# Patient Record
Sex: Female | Born: 1935 | Race: White | Hispanic: No | Marital: Married | Smoking: Former smoker
Health system: Southern US, Community
[De-identification: ages and names within clinical notes are randomized; demographics above are authoritative.]

## PROBLEM LIST (undated history)

## (undated) DIAGNOSIS — Z72820 Sleep deprivation: Secondary | ICD-10-CM

## (undated) DIAGNOSIS — E039 Hypothyroidism, unspecified: Secondary | ICD-10-CM

## (undated) DIAGNOSIS — Z8719 Personal history of other diseases of the digestive system: Secondary | ICD-10-CM

## (undated) DIAGNOSIS — J189 Pneumonia, unspecified organism: Secondary | ICD-10-CM

## (undated) DIAGNOSIS — M797 Fibromyalgia: Secondary | ICD-10-CM

## (undated) DIAGNOSIS — K219 Gastro-esophageal reflux disease without esophagitis: Secondary | ICD-10-CM

## (undated) DIAGNOSIS — M1990 Unspecified osteoarthritis, unspecified site: Secondary | ICD-10-CM

## (undated) DIAGNOSIS — F419 Anxiety disorder, unspecified: Secondary | ICD-10-CM

## (undated) DIAGNOSIS — M549 Dorsalgia, unspecified: Secondary | ICD-10-CM

## (undated) DIAGNOSIS — M19012 Primary osteoarthritis, left shoulder: Secondary | ICD-10-CM

## (undated) DIAGNOSIS — M419 Scoliosis, unspecified: Secondary | ICD-10-CM

## (undated) DIAGNOSIS — F329 Major depressive disorder, single episode, unspecified: Secondary | ICD-10-CM

## (undated) DIAGNOSIS — F32A Depression, unspecified: Secondary | ICD-10-CM

## (undated) DIAGNOSIS — E7800 Pure hypercholesterolemia, unspecified: Secondary | ICD-10-CM

## (undated) HISTORY — PX: APPENDECTOMY: SHX54

## (undated) HISTORY — PX: TONSILLECTOMY: SUR1361

## (undated) HISTORY — PX: ABDOMINAL HYSTERECTOMY: SHX81

---
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
## 1979-04-30 HISTORY — PX: LIPOSUCTION: SHX10

## 2007-03-08 ENCOUNTER — Encounter: Admission: RE | Admit: 2007-03-08 | Discharge: 2007-03-08 | Payer: Self-pay | Admitting: Orthopedic Surgery

## 2011-07-09 ENCOUNTER — Encounter: Payer: Self-pay | Admitting: *Deleted

## 2011-07-09 ENCOUNTER — Emergency Department (HOSPITAL_BASED_OUTPATIENT_CLINIC_OR_DEPARTMENT_OTHER)
Admission: EM | Admit: 2011-07-09 | Discharge: 2011-07-09 | Disposition: A | Discharge status: Home / Self Care | Payer: Medicare Other | Attending: Emergency Medicine | Admitting: Emergency Medicine

## 2011-07-09 DIAGNOSIS — F341 Dysthymic disorder: Secondary | ICD-10-CM | POA: Insufficient documentation

## 2011-07-09 DIAGNOSIS — R040 Epistaxis: Secondary | ICD-10-CM

## 2011-07-09 HISTORY — DX: Anxiety disorder, unspecified: F41.9

## 2011-07-09 HISTORY — DX: Depression, unspecified: F32.A

## 2011-07-09 HISTORY — DX: Dorsalgia, unspecified: M54.9

## 2011-07-09 HISTORY — DX: Major depressive disorder, single episode, unspecified: F32.9

## 2011-07-09 HISTORY — DX: Scoliosis, unspecified: M41.9

## 2011-07-09 MED ORDER — PHENYLEPHRINE HCL 0.5 % NA SOLN
1.0000 [drp] | Freq: Four times a day (QID) | NASAL | Status: DC | PRN
  Filled 2011-07-09: qty 15

## 2011-07-09 NOTE — ED Provider Notes (Signed)
Scribed for [name], MD, the patient was seen in room MH11/MH11 . This chart was scribed by Ellie Lunch.   CSN: [id number] Arrival date & time: 07/09/2011  7:00 PM   First MD Initiated Contact with Patient 07/09/11 1907      Chief Complaint  Patient presents with  . Epistaxis    (Consider location/radiation/quality/duration/timing/severity/associated sxs/prior treatment) Patient is a 75 y.o. female presenting with nosebleeds. The history is provided by the patient. No language interpreter was used.  Epistaxis  This is a new problem. The problem occurs every several days (2 episodes this week). The problem has been resolved. The problem is associated with an unknown factor. The bleeding has been from both (starts right) nares. She has tried nothing for the symptoms.   Meagan Mathis is a 75 y.o. female who presents to the Emergency Department complaining of 2 episodes of epistaxis in the past 7 days. Epistaxis starts from right nare. Describes bleeding as profuse. Not on blood thinners. Has passed two large clots. Additionally Pt c/o nausea, tired, and slight HA.    Past Medical History  Diagnosis Date  . Scoliosis   . Depression   . Anxiety   . Back pain     Past Surgical History  Procedure Date  . Abdominal hysterectomy     History reviewed. No pertinent family history.  History  Substance Use Topics  . Smoking status: Never Smoker   . Smokeless tobacco: Not on file  . Alcohol Use: Yes    Review of Systems  HENT: Positive for nosebleeds.   Gastrointestinal: Positive for nausea.  Neurological: Positive for headaches.    Allergies  Review of patient's allergies indicates no known allergies.  Home Medications  No current outpatient prescriptions on file.  BP 123/63  Pulse 94  Temp(Src) 98.3 F (36.8 C) (Oral)  Resp 20  Ht 5\' 3"  (1.6 m)  Wt 159 lb (72.122 kg)  BMI 28.17 kg/m2  SpO2 96%  Physical Exam  Nursing note and vitals  reviewed. Constitutional: She is oriented to person, place, and time. She appears well-developed and well-nourished.  HENT:  Head: Normocephalic and atraumatic.  Nose: Nose normal.       Small ulcerative lesion on right septum and supervision vessel. No bleeding and no blood in posterior nasal pharynx.   Eyes: EOM are normal.  Neck: Normal range of motion.  Pulmonary/Chest: Effort normal.  Musculoskeletal: Normal range of motion.  Neurological: She is alert and oriented to person, place, and time.  Skin: Skin is warm and dry.  Psychiatric: She has a normal mood and affect.    ED Course  Procedures (including critical care time) OTHER DATA REVIEWED: Nursing notes, vital signs, and past medical records reviewed.   DIAGNOSTIC STUDIES: Oxygen Saturation is 96% on room air, normal by my interpretation.     No diagnosis found.    MDM   Pt with nosebleed that is now hemostatic.  Pt denies use of blood thinners but does use steroid nasal sprays.  Small ulcer on the nasal septum and superficial vessel but no signs of bleeding currently.  Pt has opted for nasal spray and no packing.  She understands that she may rebleed and would have to return.  I personally performed the services described in this documentation, which was scribed in my presence.  The recorded information has been reviewed and considered.       [name], MD 07/09/11 2033

## 2011-07-09 NOTE — ED Notes (Signed)
Pt states this is the 2nd nosebleed she has had this week. No prior hx. +nausea, tired, shaky, slight H/A at times

## 2011-07-09 NOTE — ED Notes (Signed)
Pt has been receiving injections for her chronic back pain.

## 2012-04-17 ENCOUNTER — Emergency Department (HOSPITAL_BASED_OUTPATIENT_CLINIC_OR_DEPARTMENT_OTHER)
Admission: EM | Admit: 2012-04-17 | Discharge: 2012-04-17 | Disposition: A | Discharge status: Home / Self Care | Payer: Medicare Other | Attending: Emergency Medicine | Admitting: Emergency Medicine

## 2012-04-17 ENCOUNTER — Emergency Department (HOSPITAL_BASED_OUTPATIENT_CLINIC_OR_DEPARTMENT_OTHER): Payer: Medicare Other

## 2012-04-17 ENCOUNTER — Encounter (HOSPITAL_BASED_OUTPATIENT_CLINIC_OR_DEPARTMENT_OTHER): Payer: Self-pay | Admitting: Emergency Medicine

## 2012-04-17 DIAGNOSIS — R112 Nausea with vomiting, unspecified: Secondary | ICD-10-CM | POA: Insufficient documentation

## 2012-04-17 DIAGNOSIS — M549 Dorsalgia, unspecified: Secondary | ICD-10-CM | POA: Insufficient documentation

## 2012-04-17 DIAGNOSIS — M4120 Other idiopathic scoliosis, site unspecified: Secondary | ICD-10-CM | POA: Insufficient documentation

## 2012-04-17 DIAGNOSIS — M129 Arthropathy, unspecified: Secondary | ICD-10-CM | POA: Insufficient documentation

## 2012-04-17 DIAGNOSIS — Z885 Allergy status to narcotic agent status: Secondary | ICD-10-CM | POA: Insufficient documentation

## 2012-04-17 DIAGNOSIS — F329 Major depressive disorder, single episode, unspecified: Secondary | ICD-10-CM | POA: Insufficient documentation

## 2012-04-17 DIAGNOSIS — F3289 Other specified depressive episodes: Secondary | ICD-10-CM | POA: Insufficient documentation

## 2012-04-17 DIAGNOSIS — F411 Generalized anxiety disorder: Secondary | ICD-10-CM | POA: Insufficient documentation

## 2012-04-17 HISTORY — DX: Unspecified osteoarthritis, unspecified site: M19.90

## 2012-04-17 LAB — CBC WITH DIFFERENTIAL/PLATELET
Basophils Relative: 0 % (ref 0–1)
Eosinophils Absolute: 0.1 10*3/uL (ref 0.0–0.7)
HCT: 36.8 % (ref 36.0–46.0)
Hemoglobin: 13.5 g/dL (ref 12.0–15.0)
Lymphs Abs: 1.7 10*3/uL (ref 0.7–4.0)
MCH: 33 pg (ref 26.0–34.0)
MCHC: 36.7 g/dL — ABNORMAL HIGH (ref 30.0–36.0)
MCV: 90 fL (ref 78.0–100.0)
Monocytes Absolute: 1 10*3/uL (ref 0.1–1.0)
Monocytes Relative: 11 % (ref 3–12)
RBC: 4.09 MIL/uL (ref 3.87–5.11)

## 2012-04-17 LAB — COMPREHENSIVE METABOLIC PANEL
Albumin: 3.9 g/dL (ref 3.5–5.2)
Alkaline Phosphatase: 78 U/L (ref 39–117)
BUN: 7 mg/dL (ref 6–23)
Creatinine, Ser: 0.6 mg/dL (ref 0.50–1.10)
GFR calc Af Amer: >90 mL/min (ref >90)
Glucose, Bld: 152 mg/dL — ABNORMAL HIGH (ref 70–99)
Potassium: 3.3 mEq/L — ABNORMAL LOW (ref 3.5–5.1)
Total Bilirubin: 0.6 mg/dL (ref 0.3–1.2)
Total Protein: 6.9 g/dL (ref 6.0–8.3)

## 2012-04-17 LAB — URINALYSIS, ROUTINE W REFLEX MICROSCOPIC
Glucose, UA: NEGATIVE mg/dL
Ketones, ur: NEGATIVE mg/dL
Leukocytes, UA: NEGATIVE
Nitrite: NEGATIVE
Protein, ur: NEGATIVE mg/dL
pH: 7.5 (ref 5.0–8.0)

## 2012-04-17 MED ORDER — LORAZEPAM 2 MG/ML IJ SOLN
1.0000 mg | Freq: Once | INTRAMUSCULAR | Status: AC
  Administered 2012-04-17: 1 mg via INTRAVENOUS
  Filled 2012-04-17: qty 1

## 2012-04-17 MED ORDER — SODIUM CHLORIDE 0.9 % IV BOLUS (SEPSIS)
1000.0000 mL | Freq: Once | INTRAVENOUS | Status: AC
  Administered 2012-04-17: 1000 mL via INTRAVENOUS

## 2012-04-17 MED ORDER — LORAZEPAM 1 MG PO TABS: ORAL_TABLET | ORAL | Status: DC

## 2012-04-17 NOTE — ED Notes (Signed)
Pt c/o vomiting today. Pt states she has phenergan suppository x 1 hour PTA. Pt states she has rib pain bilaterally from washing windows.

## 2012-04-17 NOTE — ED Notes (Addendum)
Pt states she took hydrocodone/apap and robaxin tonight at 2200 and phenergan suppository at 0100. Pt states she has been out of klonopin x several days.

## 2012-04-17 NOTE — ED Provider Notes (Signed)
History     CSN: [id number]  Arrival date & time 04/17/12  0151   First MD Initiated Contact with Patient 04/17/12 0234      Chief Complaint  Patient presents with  . Nausea and vomiting     (Consider location/radiation/quality/duration/timing/severity/associated sxs/prior treatment) HPI This is a 76 year old white female who has had nausea and vomiting since yesterday morning. She states the symptoms have been moderate to severe she feels like she is hydrated. She states her mouth is dry and her abdomen feels distended. She had 2 bowel movements yesterday which were not diarrhea. Nursing staff reports that she was anxious in triage. She has been having back pain for over a month which is being treated by a spinal surgeon. She was recently discontinued from Klonopin. She denies abdominal pain but is having rib pain due recently washing windows. The rib pain is worst in the left lower chest. She did take a Phenergan suppository about an hour prior to arrival with some relief of nausea.  Past Medical History  Diagnosis Date  . Scoliosis   . Depression   . Anxiety   . Back pain   . Arthritis     Past Surgical History  Procedure Date  . Abdominal hysterectomy     No family history on file.  History  Substance Use Topics  . Smoking status: Never Smoker   . Smokeless tobacco: Not on file  . Alcohol Use: Yes    OB History    Grav Para Term Preterm Abortions TAB SAB Ect Mult Living                  Review of Systems  All other systems reviewed and are negative.    Allergies  Codeine  Home Medications   Current Outpatient Rx  Name Route Sig Dispense Refill  . CITALOPRAM HYDROBROMIDE 20 MG PO TABS Oral Take 20 mg by mouth daily.    Marland Kitchen HYDROCODONE-ACETAMINOPHEN 10-400 MG PO TABS Oral Take 1 tablet by mouth every 6 (six) hours as needed.    . METHOCARBAMOL 500 MG PO TABS Oral Take by mouth as needed.    Marland Kitchen PROMETHAZINE HCL 25 MG RE SUPP Rectal Place 25 mg rectally as  needed.    . ATORVASTATIN CALCIUM 20 MG PO TABS Oral Take 20 mg by mouth daily.      Marland Kitchen CLONAZEPAM 1 MG PO TABS Oral Take 2 mg by mouth at bedtime.      Marland Kitchen ESTRADIOL 0.25 MG/0.25GM TD GEL Transdermal Place 1 packet onto the skin daily. Place on the upper thigh     . GABAPENTIN 300 MG PO CAPS Oral Take 600-900 mg by mouth 3 (three) times daily as needed. For nerve pain      . LEVOTHYROXINE SODIUM 50 MCG PO TABS Oral Take 50 mcg by mouth daily.      Marland Kitchen OVER THE COUNTER MEDICATION Oral Take 30 mLs by mouth at bedtime. Liquid calcium/vitamin d/magnesium/zinc     . OVER THE COUNTER MEDICATION Oral Take 1 tablet by mouth 3 (three) times daily. Insta-flex       BP 179/77  Pulse 74  Temp 97.9 F (36.6 C) (Oral)  Resp 18  Ht 5\' 4"  (1.626 m)  Wt 150 lb (68.04 kg)  BMI 25.75 kg/m2  SpO2 99%  Physical Exam General: Well-developed, well-nourished female in no acute distress; appearance consistent with age of record HENT: normocephalic, atraumatic Eyes: pupils equal round and reactive to light; extraocular muscles intact  Neck: supple Heart: regular rate and rhythm Lungs: clear to auscultation bilaterally Abdomen: soft; mildly distended; nontender; bowel sounds present Extremities: No deformity; full range of motion; pulses normal Neurologic: Awake, alert and oriented; motor function intact in all extremities and symmetric; no facial droop Skin: Warm and dry Psychiatric: Anxious    ED Course  Procedures (including critical care time)     MDM   Nursing notes and vitals signs, including pulse oximetry, reviewed.  Summary of this visit's results, reviewed by myself:  Labs:  Results for orders placed during the hospital encounter of 04/17/12  CBC WITH DIFFERENTIAL      Component Value Range   WBC 9.1  4.0 - 10.5 K/uL   RBC 4.09  3.87 - 5.11 MIL/uL   Hemoglobin 13.5  12.0 - 15.0 g/dL   HCT 16.1  09.6 - 04.5 %   MCV 90.0  78.0 - 100.0 fL   MCH 33.0  26.0 - 34.0 pg   MCHC 36.7 (*)  30.0 - 36.0 g/dL   RDW 40.9  81.1 - 91.4 %   Platelets 219  150 - 400 K/uL   Neutrophils Relative 69  43 - 77 %   Neutro Abs 6.3  1.7 - 7.7 K/uL   Lymphocytes Relative 19  12 - 46 %   Lymphs Abs 1.7  0.7 - 4.0 K/uL   Monocytes Relative 11  3 - 12 %   Monocytes Absolute 1.0  0.1 - 1.0 K/uL   Eosinophils Relative 1  0 - 5 %   Eosinophils Absolute 0.1  0.0 - 0.7 K/uL   Basophils Relative 0  0 - 1 %   Basophils Absolute 0.0  0.0 - 0.1 K/uL  COMPREHENSIVE METABOLIC PANEL      Component Value Range   Sodium 126 (*) 135 - 145 mEq/L   Potassium 3.3 (*) 3.5 - 5.1 mEq/L   Chloride 88 (*) 96 - 112 mEq/L   CO2 26  19 - 32 mEq/L   Glucose, Bld 152 (*) 70 - 99 mg/dL   BUN 7  6 - 23 mg/dL   Creatinine, Ser 7.82  0.50 - 1.10 mg/dL   Calcium 9.0  8.4 - 95.6 mg/dL   Total Protein 6.9  6.0 - 8.3 g/dL   Albumin 3.9  3.5 - 5.2 g/dL   AST 18  0 - 37 U/L   ALT 16  0 - 35 U/L   Alkaline Phosphatase 78  39 - 117 U/L   Total Bilirubin 0.6  0.3 - 1.2 mg/dL   GFR calc non Af Amer 88 (*) >90 mL/min   GFR calc Af Amer >90  >90 mL/min  URINALYSIS, ROUTINE W REFLEX MICROSCOPIC      Component Value Range   Color, Urine YELLOW  YELLOW   APPearance CLEAR  CLEAR   Specific Gravity, Urine 1.007  1.005 - 1.030   pH 7.5  5.0 - 8.0   Glucose, UA NEGATIVE  NEGATIVE mg/dL   Hgb urine dipstick NEGATIVE  NEGATIVE   Bilirubin Urine NEGATIVE  NEGATIVE   Ketones, ur NEGATIVE  NEGATIVE mg/dL   Protein, ur NEGATIVE  NEGATIVE mg/dL   Urobilinogen, UA 0.2  0.0 - 1.0 mg/dL   Nitrite NEGATIVE  NEGATIVE   Leukocytes, UA NEGATIVE  NEGATIVE    Imaging Studies: Dg Abd Acute W/chest  04/17/2012  *RADIOLOGY REPORT*  Clinical Data: Nausea, vomiting, constipation, rib pain.  ACUTE ABDOMEN SERIES (ABDOMEN 2 VIEW & CHEST 1 VIEW)  Comparison:  None.  Findings: Normal heart size and pulmonary vascularity.  Mild emphysematous changes in the lungs.  Slight fibrosis in the left lung base.  Increased density in the right upper lung is  probably due to calcification of the first costochondral junction.  No focal airspace consolidation.  No blunting of costophrenic angles.  No pneumothorax.  Mediastinal contours appear intact.  Scattered gas and stool in the colon.  Gas within small bowel.  No small or large bowel distension.  No free intra-abdominal air.  No abnormal air fluid levels.  No radiopaque stones.  Calcified phleboliths in the pelvis.  Lumbar scoliosis and degenerative changes.  IMPRESSION: No evidence of active pulmonary disease.  Nonobstructive bowel gas pattern.   Original Report Authenticated By: Marlon Pel, M.D.    4:30 AM Patient feels better after IV fluid bolus and Ativan 1 mg IV. She is drinking fluids without emesis.         Hanley Seamen, MD 04/17/12 0430

## 2012-04-26 ENCOUNTER — Other Ambulatory Visit: Payer: Self-pay | Admitting: Rehabilitation

## 2012-04-26 DIAGNOSIS — M549 Dorsalgia, unspecified: Secondary | ICD-10-CM

## 2012-12-01 HISTORY — PX: CATARACT EXTRACTION W/ INTRAOCULAR LENS  IMPLANT, BILATERAL: SHX1307

## 2013-06-11 ENCOUNTER — Other Ambulatory Visit: Payer: Self-pay | Admitting: Rehabilitation

## 2013-06-11 DIAGNOSIS — M7542 Impingement syndrome of left shoulder: Secondary | ICD-10-CM

## 2013-06-13 ENCOUNTER — Other Ambulatory Visit: Payer: Medicare Other

## 2013-10-10 ENCOUNTER — Other Ambulatory Visit: Payer: Self-pay | Admitting: Orthopedic Surgery

## 2013-10-14 ENCOUNTER — Encounter (HOSPITAL_COMMUNITY): Payer: Self-pay | Admitting: Pharmacy Technician

## 2013-10-16 NOTE — Pre-Procedure Instructions (Signed)
Meagan CongressRebecca J Mathis  10/16/2013   Your procedure is scheduled on:  Tuesday, February 24.  [facility]/Entrance "A" at 5:30 AM.  Call this number if you have problems the morning of surgery: [phone]   Remember:   Do not eat food or drink liquids after midnight Monday.   Take these medicines the morning of surgery with A SIP OF WATER: methocarbamol (ROBAXIN), pantoprazole (PROTONIX).              Stop taking all Herbal medications.    Do not wear jewelry, make-up or nail polish.  Do not wear lotions, powders, or perfumes.  Do not shave 48 hours prior to surgery.   Do not bring valuables to the hospital.  [facility] is not responsible for any belongings or valuables.               Contacts, dentures or bridgework may not be worn into surgery.  Leave suitcase in the car. After surgery it may be brought to your room.  For patients admitted to the hospital, discharge time is determined by your treatment team.               Patients discharged the day of surgery will not be allowed to drive home.  Name and phone number of your driver: -   Special Instructions: Review  [place] - Preparing For Surgery.   Please read over the following fact sheets that you were given: Pain Booklet, Coughing and Deep Breathing, Blood Transfusion Information and Surgical Site Infection Prevention

## 2013-10-17 ENCOUNTER — Encounter (HOSPITAL_COMMUNITY): Payer: Self-pay

## 2013-10-17 ENCOUNTER — Encounter (HOSPITAL_COMMUNITY)
Admission: RE | Admit: 2013-10-17 | Discharge: 2013-10-17 | Disposition: A | Discharge status: Home / Self Care | Payer: Medicare Other | Source: Ambulatory Visit | Attending: Orthopedic Surgery | Admitting: Orthopedic Surgery

## 2013-10-17 DIAGNOSIS — Z01812 Encounter for preprocedural laboratory examination: Secondary | ICD-10-CM | POA: Insufficient documentation

## 2013-10-17 HISTORY — DX: Gastro-esophageal reflux disease without esophagitis: K21.9

## 2013-10-17 LAB — CBC
HCT: 40.6 % (ref 36.0–46.0)
HEMOGLOBIN: 14.1 g/dL (ref 12.0–15.0)
MCH: 33.3 pg (ref 26.0–34.0)
MCHC: 34.7 g/dL (ref 30.0–36.0)
MCV: 96 fL (ref 78.0–100.0)
PLATELETS: 242 10*3/uL (ref 150–400)
RBC: 4.23 MIL/uL (ref 3.87–5.11)
RDW: 13 % (ref 11.5–15.5)
WBC: 7.5 10*3/uL (ref 4.0–10.5)

## 2013-10-17 LAB — BASIC METABOLIC PANEL
BUN: 8 mg/dL (ref 6–23)
CO2: 27 mEq/L (ref 19–32)
Calcium: 9.4 mg/dL (ref 8.4–10.5)
Chloride: 97 mEq/L (ref 96–112)
Creatinine, Ser: 0.83 mg/dL (ref 0.50–1.10)
GFR, EST AFRICAN AMERICAN: 78 mL/min — AB (ref >90)
GFR, EST NON AFRICAN AMERICAN: 67 mL/min — AB (ref >90)
Glucose, Bld: 92 mg/dL (ref 70–99)
POTASSIUM: 4.4 meq/L (ref 3.7–5.3)
SODIUM: 137 meq/L (ref 137–147)

## 2013-10-17 LAB — TYPE AND SCREEN
ABO/RH(D): O POS
Antibody Screen: NEGATIVE

## 2013-10-17 LAB — ABO/RH: ABO/RH(D): O POS

## 2013-10-17 NOTE — Progress Notes (Signed)
Primary physician - dr. Zoe Lanvalezquez Does not have a cardiologist No prior cardiac testing

## 2013-10-21 MED ORDER — CEFAZOLIN SODIUM-DEXTROSE 2-3 GM-% IV SOLR
2.0000 g | INTRAVENOUS | Status: AC
  Administered 2013-10-22: 2 g via INTRAVENOUS
  Filled 2013-10-21: qty 50

## 2013-10-22 ENCOUNTER — Encounter (HOSPITAL_COMMUNITY): Payer: Self-pay | Admitting: *Deleted

## 2013-10-22 ENCOUNTER — Encounter (HOSPITAL_COMMUNITY)
Admission: RE | Disposition: A | Discharge status: Home / Self Care | Payer: Self-pay | Source: Ambulatory Visit | Attending: Orthopedic Surgery

## 2013-10-22 ENCOUNTER — Inpatient Hospital Stay (HOSPITAL_COMMUNITY)
Admission: RE | Admit: 2013-10-22 | Discharge: 2013-10-23 | DRG: 483 | Disposition: A | Discharge status: Home / Self Care | Payer: Medicare Other | Source: Ambulatory Visit | Attending: Orthopedic Surgery | Admitting: Orthopedic Surgery

## 2013-10-22 ENCOUNTER — Inpatient Hospital Stay (HOSPITAL_COMMUNITY): Payer: Medicare Other

## 2013-10-22 ENCOUNTER — Inpatient Hospital Stay (HOSPITAL_COMMUNITY): Payer: Medicare Other | Admitting: Certified Registered Nurse Anesthetist

## 2013-10-22 ENCOUNTER — Encounter (HOSPITAL_COMMUNITY): Payer: Medicare Other | Admitting: Certified Registered Nurse Anesthetist

## 2013-10-22 DIAGNOSIS — K219 Gastro-esophageal reflux disease without esophagitis: Secondary | ICD-10-CM | POA: Diagnosis present

## 2013-10-22 DIAGNOSIS — M19019 Primary osteoarthritis, unspecified shoulder: Principal | ICD-10-CM | POA: Diagnosis present

## 2013-10-22 DIAGNOSIS — D62 Acute posthemorrhagic anemia: Secondary | ICD-10-CM | POA: Diagnosis not present

## 2013-10-22 DIAGNOSIS — IMO0001 Reserved for inherently not codable concepts without codable children: Secondary | ICD-10-CM | POA: Diagnosis present

## 2013-10-22 DIAGNOSIS — M4120 Other idiopathic scoliosis, site unspecified: Secondary | ICD-10-CM | POA: Diagnosis present

## 2013-10-22 DIAGNOSIS — M19012 Primary osteoarthritis, left shoulder: Secondary | ICD-10-CM

## 2013-10-22 DIAGNOSIS — F3289 Other specified depressive episodes: Secondary | ICD-10-CM | POA: Diagnosis present

## 2013-10-22 DIAGNOSIS — F329 Major depressive disorder, single episode, unspecified: Secondary | ICD-10-CM | POA: Diagnosis present

## 2013-10-22 DIAGNOSIS — F411 Generalized anxiety disorder: Secondary | ICD-10-CM | POA: Diagnosis present

## 2013-10-22 HISTORY — PX: TOTAL SHOULDER ARTHROPLASTY: SHX126

## 2013-10-22 HISTORY — DX: Hypothyroidism, unspecified: E03.9

## 2013-10-22 HISTORY — DX: Pneumonia, unspecified organism: J18.9

## 2013-10-22 HISTORY — DX: Personal history of other diseases of the digestive system: Z87.19

## 2013-10-22 HISTORY — DX: Primary osteoarthritis, left shoulder: M19.012

## 2013-10-22 HISTORY — DX: Sleep deprivation: Z72.820

## 2013-10-22 HISTORY — DX: Fibromyalgia: M79.7

## 2013-10-22 HISTORY — DX: Pure hypercholesterolemia, unspecified: E78.00

## 2013-10-22 SURGERY — ARTHROPLASTY, SHOULDER, TOTAL
Anesthesia: Regional | Site: Shoulder | Laterality: Left

## 2013-10-22 MED ORDER — GLYCOPYRROLATE 0.2 MG/ML IJ SOLN
INTRAMUSCULAR | Status: DC | PRN
  Administered 2013-10-22: 0.2 mg via INTRAVENOUS
  Administered 2013-10-22: .6 mg via INTRAVENOUS

## 2013-10-22 MED ORDER — ALBUMIN HUMAN 5 % IV SOLN
INTRAVENOUS | Status: DC | PRN
  Administered 2013-10-22: 09:00:00 via INTRAVENOUS

## 2013-10-22 MED ORDER — DIPHENHYDRAMINE HCL 12.5 MG/5ML PO ELIX: 12.5000 mg | ORAL_SOLUTION | ORAL | Status: DC | PRN

## 2013-10-22 MED ORDER — OXYCODONE HCL 5 MG PO TABS: 5.0000 mg | ORAL_TABLET | Freq: Once | ORAL | Status: DC | PRN

## 2013-10-22 MED ORDER — PROPOFOL 10 MG/ML IV BOLUS
INTRAVENOUS | Status: DC | PRN
  Administered 2013-10-22: 160 mg via INTRAVENOUS

## 2013-10-22 MED ORDER — NIACIN 50 MG PO TABS
50.0000 mg | ORAL_TABLET | Freq: Two times a day (BID) | ORAL | Status: DC
  Administered 2013-10-22 – 2013-10-23 (×2): 50 mg via ORAL
  Filled 2013-10-22 (×4): qty 1

## 2013-10-22 MED ORDER — DEXAMETHASONE SODIUM PHOSPHATE 4 MG/ML IJ SOLN
INTRAMUSCULAR | Status: DC | PRN
  Administered 2013-10-22: 4 mg via INTRAVENOUS

## 2013-10-22 MED ORDER — BUPIVACAINE HCL (PF) 0.25 % IJ SOLN
INTRAMUSCULAR | Status: DC | PRN
  Administered 2013-10-22: 20 mL via PERINEURAL

## 2013-10-22 MED ORDER — GLYCOPYRROLATE 0.2 MG/ML IJ SOLN
INTRAMUSCULAR | Status: AC
  Filled 2013-10-22: qty 3

## 2013-10-22 MED ORDER — ONDANSETRON HCL 4 MG/2ML IJ SOLN
4.0000 mg | Freq: Four times a day (QID) | INTRAMUSCULAR | Status: DC | PRN
  Administered 2013-10-22: 4 mg via INTRAVENOUS
  Filled 2013-10-22: qty 2

## 2013-10-22 MED ORDER — LIDOCAINE HCL (CARDIAC) 20 MG/ML IV SOLN
INTRAVENOUS | Status: AC
  Filled 2013-10-22: qty 5

## 2013-10-22 MED ORDER — METHOCARBAMOL 100 MG/ML IJ SOLN: 500.0000 mg | Freq: Four times a day (QID) | INTRAMUSCULAR | Status: DC | PRN

## 2013-10-22 MED ORDER — ROCURONIUM BROMIDE 100 MG/10ML IV SOLN
INTRAVENOUS | Status: DC | PRN
  Administered 2013-10-22: 40 mg via INTRAVENOUS

## 2013-10-22 MED ORDER — METHOCARBAMOL 500 MG PO TABS: 500.0000 mg | ORAL_TABLET | Freq: Four times a day (QID) | ORAL | Status: DC | PRN

## 2013-10-22 MED ORDER — THYROID 30 MG PO TABS
30.0000 mg | ORAL_TABLET | Freq: Every day | ORAL | Status: DC
  Administered 2013-10-23: 30 mg via ORAL
  Filled 2013-10-22 (×2): qty 1

## 2013-10-22 MED ORDER — LIDOCAINE HCL (CARDIAC) 20 MG/ML IV SOLN
INTRAVENOUS | Status: DC | PRN
  Administered 2013-10-22: 40 mg via INTRAVENOUS

## 2013-10-22 MED ORDER — PROGESTERONE MICRONIZED 100 MG PO CAPS
200.0000 mg | ORAL_CAPSULE | Freq: Every day | ORAL | Status: DC
  Administered 2013-10-23: 200 mg via ORAL
  Filled 2013-10-22: qty 1
  Filled 2013-10-22: qty 2

## 2013-10-22 MED ORDER — METHOCARBAMOL 750 MG PO TABS
1500.0000 mg | ORAL_TABLET | Freq: Three times a day (TID) | ORAL | Status: DC
  Administered 2013-10-22 – 2013-10-23 (×2): 1500 mg via ORAL
  Filled 2013-10-22 (×5): qty 2

## 2013-10-22 MED ORDER — ACETAMINOPHEN 325 MG PO TABS: 650.0000 mg | ORAL_TABLET | Freq: Four times a day (QID) | ORAL | Status: DC | PRN

## 2013-10-22 MED ORDER — ONDANSETRON HCL 4 MG PO TABS: 4.0000 mg | ORAL_TABLET | Freq: Three times a day (TID) | ORAL | Status: DC | PRN

## 2013-10-22 MED ORDER — SODIUM CHLORIDE 0.9 % IR SOLN
Status: DC | PRN
  Administered 2013-10-22: 1000 mL

## 2013-10-22 MED ORDER — PHENYLEPHRINE HCL 10 MG/ML IJ SOLN
10.0000 mg | INTRAMUSCULAR | Status: DC | PRN
  Administered 2013-10-22: 20 ug/min via INTRAVENOUS

## 2013-10-22 MED ORDER — PHENOL 1.4 % MT LIQD
1.0000 | OROMUCOSAL | Status: DC | PRN
  Administered 2013-10-22: 1 via OROMUCOSAL
  Filled 2013-10-22: qty 177

## 2013-10-22 MED ORDER — ALUM & MAG HYDROXIDE-SIMETH 200-200-20 MG/5ML PO SUSP: 30.0000 mL | ORAL | Status: DC | PRN

## 2013-10-22 MED ORDER — OXYCODONE HCL 5 MG/5ML PO SOLN: 5.0000 mg | Freq: Once | ORAL | Status: DC | PRN

## 2013-10-22 MED ORDER — DOCUSATE SODIUM 100 MG PO CAPS
100.0000 mg | ORAL_CAPSULE | Freq: Two times a day (BID) | ORAL | Status: DC
  Administered 2013-10-23: 100 mg via ORAL
  Filled 2013-10-22 (×3): qty 1

## 2013-10-22 MED ORDER — METOCLOPRAMIDE HCL 5 MG/ML IJ SOLN
5.0000 mg | Freq: Three times a day (TID) | INTRAMUSCULAR | Status: DC | PRN
  Administered 2013-10-22: 10 mg via INTRAVENOUS
  Filled 2013-10-22: qty 2

## 2013-10-22 MED ORDER — ROCURONIUM BROMIDE 50 MG/5ML IV SOLN
INTRAVENOUS | Status: AC
  Filled 2013-10-22: qty 1

## 2013-10-22 MED ORDER — PANTOPRAZOLE SODIUM 40 MG PO TBEC
40.0000 mg | DELAYED_RELEASE_TABLET | Freq: Every morning | ORAL | Status: DC
  Administered 2013-10-23: 40 mg via ORAL
  Filled 2013-10-22: qty 1

## 2013-10-22 MED ORDER — PROPOFOL 10 MG/ML IV BOLUS
INTRAVENOUS | Status: AC
  Filled 2013-10-22: qty 20

## 2013-10-22 MED ORDER — GLYCOPYRROLATE 0.2 MG/ML IJ SOLN
INTRAMUSCULAR | Status: AC
  Filled 2013-10-22: qty 1

## 2013-10-22 MED ORDER — LACTATED RINGERS IV SOLN
INTRAVENOUS | Status: DC | PRN
  Administered 2013-10-22 (×2): via INTRAVENOUS

## 2013-10-22 MED ORDER — OXYCODONE-ACETAMINOPHEN 5-325 MG PO TABS
1.0000 | ORAL_TABLET | Freq: Four times a day (QID) | ORAL | Status: DC | PRN
  Administered 2013-10-22 – 2013-10-23 (×4): 1 via ORAL
  Filled 2013-10-22 (×4): qty 1

## 2013-10-22 MED ORDER — POTASSIUM CHLORIDE IN NACL 20-0.45 MEQ/L-% IV SOLN
INTRAVENOUS | Status: DC
  Administered 2013-10-22 – 2013-10-23 (×2): via INTRAVENOUS
  Filled 2013-10-22 (×3): qty 1000

## 2013-10-22 MED ORDER — ONDANSETRON HCL 4 MG/2ML IJ SOLN
INTRAMUSCULAR | Status: AC
  Filled 2013-10-22: qty 2

## 2013-10-22 MED ORDER — HYDROMORPHONE HCL PF 1 MG/ML IJ SOLN
0.2500 mg | INTRAMUSCULAR | Status: DC | PRN
  Administered 2013-10-22: 0.25 mg via INTRAVENOUS

## 2013-10-22 MED ORDER — WHITE PETROLATUM GEL
Status: AC
  Administered 2013-10-22: 0.2
  Filled 2013-10-22: qty 5

## 2013-10-22 MED ORDER — METOCLOPRAMIDE HCL 5 MG/ML IJ SOLN: 10.0000 mg | Freq: Once | INTRAMUSCULAR | Status: DC | PRN

## 2013-10-22 MED ORDER — HYDROMORPHONE HCL PF 1 MG/ML IJ SOLN
0.5000 mg | INTRAMUSCULAR | Status: DC | PRN
  Filled 2013-10-22: qty 1

## 2013-10-22 MED ORDER — MIDAZOLAM HCL 5 MG/5ML IJ SOLN
INTRAMUSCULAR | Status: DC | PRN
  Administered 2013-10-22: 2 mg via INTRAVENOUS

## 2013-10-22 MED ORDER — SENNA 8.6 MG PO TABS
1.0000 | ORAL_TABLET | Freq: Two times a day (BID) | ORAL | Status: DC
  Filled 2013-10-22 (×3): qty 1

## 2013-10-22 MED ORDER — MENTHOL 3 MG MT LOZG
1.0000 | LOZENGE | OROMUCOSAL | Status: DC | PRN
Start: 2013-10-22 — End: 2013-10-23
  Filled 2013-10-22: qty 9

## 2013-10-22 MED ORDER — MELATONIN 5 MG PO TABS
5.0000 mg | ORAL_TABLET | Freq: Every day | ORAL | Status: DC
  Filled 2013-10-22: qty 1

## 2013-10-22 MED ORDER — NEOSTIGMINE METHYLSULFATE 1 MG/ML IJ SOLN
INTRAMUSCULAR | Status: AC
  Filled 2013-10-22: qty 10

## 2013-10-22 MED ORDER — METOCLOPRAMIDE HCL 10 MG PO TABS: 5.0000 mg | ORAL_TABLET | Freq: Three times a day (TID) | ORAL | Status: DC | PRN

## 2013-10-22 MED ORDER — ATROPINE SULFATE 0.1 MG/ML IJ SOLN
INTRAMUSCULAR | Status: AC
  Filled 2013-10-22: qty 10

## 2013-10-22 MED ORDER — CLONAZEPAM 1 MG PO TABS
1.5000 mg | ORAL_TABLET | Freq: Every day | ORAL | Status: DC
  Administered 2013-10-22: 1.5 mg via ORAL
  Filled 2013-10-22 (×2): qty 1

## 2013-10-22 MED ORDER — HYDROCODONE-ACETAMINOPHEN 10-325 MG PO TABS: 1.0000 | ORAL_TABLET | Freq: Four times a day (QID) | ORAL | Status: DC | PRN

## 2013-10-22 MED ORDER — FENTANYL CITRATE 0.05 MG/ML IJ SOLN
INTRAMUSCULAR | Status: AC
  Filled 2013-10-22: qty 5

## 2013-10-22 MED ORDER — ATROPINE SULFATE 1 MG/ML IJ SOLN
INTRAMUSCULAR | Status: DC | PRN
  Administered 2013-10-22: 0.4 mg via INTRAVENOUS

## 2013-10-22 MED ORDER — SENNA-DOCUSATE SODIUM 8.6-50 MG PO TABS
2.0000 | ORAL_TABLET | Freq: Every day | ORAL | Status: DC
Start: 2013-10-22 — End: 2016-07-01

## 2013-10-22 MED ORDER — FENTANYL CITRATE 0.05 MG/ML IJ SOLN
INTRAMUSCULAR | Status: DC | PRN
  Administered 2013-10-22: 100 ug via INTRAVENOUS
  Administered 2013-10-22: 50 ug via INTRAVENOUS

## 2013-10-22 MED ORDER — HYDROMORPHONE HCL PF 1 MG/ML IJ SOLN
INTRAMUSCULAR | Status: AC
  Administered 2013-10-22: 1 mg
  Filled 2013-10-22: qty 1

## 2013-10-22 MED ORDER — ONDANSETRON HCL 4 MG/2ML IJ SOLN
INTRAMUSCULAR | Status: DC | PRN
  Administered 2013-10-22: 4 mg via INTRAVENOUS

## 2013-10-22 MED ORDER — ONDANSETRON HCL 4 MG PO TABS
4.0000 mg | ORAL_TABLET | Freq: Four times a day (QID) | ORAL | Status: DC | PRN
  Administered 2013-10-23: 4 mg via ORAL
  Filled 2013-10-22: qty 1

## 2013-10-22 MED ORDER — ACETAMINOPHEN 650 MG RE SUPP: 650.0000 mg | Freq: Four times a day (QID) | RECTAL | Status: DC | PRN

## 2013-10-22 MED ORDER — CEFAZOLIN SODIUM 1-5 GM-% IV SOLN
1.0000 g | Freq: Four times a day (QID) | INTRAVENOUS | Status: AC
  Administered 2013-10-22 – 2013-10-23 (×3): 1 g via INTRAVENOUS
  Filled 2013-10-22 (×3): qty 50

## 2013-10-22 MED ORDER — MIDAZOLAM HCL 2 MG/2ML IJ SOLN
INTRAMUSCULAR | Status: AC
  Filled 2013-10-22: qty 2

## 2013-10-22 MED ORDER — BISACODYL 10 MG RE SUPP: 10.0000 mg | Freq: Every day | RECTAL | Status: DC | PRN

## 2013-10-22 MED ORDER — HYDROCODONE-ACETAMINOPHEN 10-325 MG PO TABS
1.0000 | ORAL_TABLET | ORAL | Status: DC | PRN
  Filled 2013-10-22: qty 1

## 2013-10-22 MED ORDER — NEOSTIGMINE METHYLSULFATE 1 MG/ML IJ SOLN
INTRAMUSCULAR | Status: DC | PRN
  Administered 2013-10-22: 4 mg via INTRAVENOUS

## 2013-10-22 MED ORDER — MELATONIN 3 MG PO TABS
6.0000 mg | ORAL_TABLET | Freq: Every day | ORAL | Status: DC
  Administered 2013-10-22: 6 mg via ORAL
  Filled 2013-10-22 (×2): qty 2

## 2013-10-22 MED ORDER — DEXAMETHASONE SODIUM PHOSPHATE 4 MG/ML IJ SOLN
INTRAMUSCULAR | Status: AC
  Filled 2013-10-22: qty 1

## 2013-10-22 MED ORDER — POLYETHYLENE GLYCOL 3350 17 G PO PACK: 17.0000 g | PACK | Freq: Every day | ORAL | Status: DC | PRN

## 2013-10-22 SURGICAL SUPPLY — 62 items
BENZOIN TINCTURE PRP APPL 2/3 (GAUZE/BANDAGES/DRESSINGS) ×3 IMPLANT
BLADE SAW SGTL MED 73X18.5 STR (BLADE) ×3 IMPLANT
BOOTCOVER CLEANROOM LRG (PROTECTIVE WEAR) ×6 IMPLANT
BOWL SMART MIX CTS (DISPOSABLE) IMPLANT
BRUSH FEMORAL CANAL (MISCELLANEOUS) IMPLANT
CAP TOTAL SHOULDER ×3 IMPLANT
CEMENT BONE DEPUY (Cement) ×3 IMPLANT
CLOSURE STERI-STRIP 1/2X4 (GAUZE/BANDAGES/DRESSINGS) ×1
CLOTH BEACON ORANGE TIMEOUT ST (SAFETY) ×3 IMPLANT
CLSR STERI-STRIP ANTIMIC 1/2X4 (GAUZE/BANDAGES/DRESSINGS) ×2 IMPLANT
COVER SURGICAL LIGHT HANDLE (MISCELLANEOUS) ×3 IMPLANT
COVER TABLE BACK 60X90 (DRAPES) IMPLANT
DRAPE C-ARM 42X72 X-RAY (DRAPES) IMPLANT
DRAPE INCISE IOBAN 66X45 STRL (DRAPES) ×3 IMPLANT
DRAPE U-SHAPE 47X51 STRL (DRAPES) ×3 IMPLANT
DRSG MEPILEX BORDER 4X8 (GAUZE/BANDAGES/DRESSINGS) ×3 IMPLANT
DRSG PAD ABDOMINAL 8X10 ST (GAUZE/BANDAGES/DRESSINGS) ×3 IMPLANT
DURAPREP 26ML APPLICATOR (WOUND CARE) ×3 IMPLANT
ELECT BLADE 6.5 EXT (BLADE) ×3 IMPLANT
ELECT NEEDLE TIP 2.8 STRL (NEEDLE) ×3 IMPLANT
ELECT REM PT RETURN 9FT ADLT (ELECTROSURGICAL) ×3
ELECTRODE REM PT RTRN 9FT ADLT (ELECTROSURGICAL) ×1 IMPLANT
EVACUATOR 1/8 PVC DRAIN (DRAIN) IMPLANT
FACESHIELD LNG OPTICON STERILE (SAFETY) IMPLANT
GLOVE BIOGEL PI ORTHO PRO SZ8 (GLOVE) ×4
GLOVE ORTHO TXT STRL SZ7.5 (GLOVE) ×3 IMPLANT
GLOVE PI ORTHO PRO STRL SZ8 (GLOVE) ×2 IMPLANT
GLOVE SURG ORTHO 8.0 STRL STRW (GLOVE) ×6 IMPLANT
GOWN BRE IMP PREV XXLGXLNG (GOWN DISPOSABLE) ×3 IMPLANT
GOWN STRL REIN XL XLG (GOWN DISPOSABLE) ×3 IMPLANT
HANDPIECE INTERPULSE COAX TIP (DISPOSABLE)
HOOD PEEL AWAY FACE SHEILD DIS (HOOD) ×6 IMPLANT
KIT BASIN OR (CUSTOM PROCEDURE TRAY) ×3 IMPLANT
KIT ROOM TURNOVER OR (KITS) ×3 IMPLANT
MANIFOLD NEPTUNE II (INSTRUMENTS) ×3 IMPLANT
NEEDLE 1/2 CIR CATGUT .05X1.09 (NEEDLE) ×3 IMPLANT
NEEDLE HYPO 25GX1X1/2 BEV (NEEDLE) ×3 IMPLANT
NS IRRIG 1000ML POUR BTL (IV SOLUTION) ×3 IMPLANT
PACK SHOULDER (CUSTOM PROCEDURE TRAY) ×3 IMPLANT
PAD ARMBOARD 7.5X6 YLW CONV (MISCELLANEOUS) ×6 IMPLANT
SET HNDPC FAN SPRY TIP SCT (DISPOSABLE) IMPLANT
SLING ARM IMMOBILIZER LRG (SOFTGOODS) IMPLANT
SLING ARM IMMOBILIZER MED (SOFTGOODS) ×3 IMPLANT
SMARTMIX MINI TOWER (MISCELLANEOUS)
SPONGE GAUZE 4X4 12PLY (GAUZE/BANDAGES/DRESSINGS) ×3 IMPLANT
SPONGE LAP 18X18 X RAY DECT (DISPOSABLE) ×3 IMPLANT
SUCTION FRAZIER TIP 10 FR DISP (SUCTIONS) ×3 IMPLANT
SUPPORT WRAP ARM LG (MISCELLANEOUS) ×3 IMPLANT
SUT FIBERWIRE #2 38 REV NDL BL (SUTURE) ×15
SUT MNCRL AB 4-0 PS2 18 (SUTURE) ×3 IMPLANT
SUT VIC AB 0 CT1 27 (SUTURE) ×2
SUT VIC AB 0 CT1 27XBRD ANBCTR (SUTURE) ×1 IMPLANT
SUT VIC AB 2-0 CT1 27 (SUTURE) ×2
SUT VIC AB 2-0 CT1 TAPERPNT 27 (SUTURE) ×1 IMPLANT
SUT VIC AB 3-0 SH 18 (SUTURE) ×3 IMPLANT
SUTURE FIBERWR#2 38 REV NDL BL (SUTURE) ×5 IMPLANT
SYR CONTROL 10ML LL (SYRINGE) IMPLANT
TOWEL OR 17X24 6PK STRL BLUE (TOWEL DISPOSABLE) ×3 IMPLANT
TOWEL OR 17X26 10 PK STRL BLUE (TOWEL DISPOSABLE) ×3 IMPLANT
TOWER SMARTMIX MINI (MISCELLANEOUS) IMPLANT
TRAY FOLEY CATH 16FRSI W/METER (SET/KITS/TRAYS/PACK) IMPLANT
WATER STERILE IRR 1000ML POUR (IV SOLUTION) ×3 IMPLANT

## 2013-10-22 NOTE — Progress Notes (Signed)
Orthopedic Tech Progress Note Patient Details:  Meagan CongressRebecca J Mathis 1936-07-10 [id number] Visited with patient in room. Patient already has arm sling. No action needed from [employer].  Patient ID: Meagan Mathis, female   DOB: 1936-07-10, 78 y.o.   MRN: [id number]   Meagan Mathis 10/22/2013, 3:49 PM

## 2013-10-22 NOTE — Anesthesia Procedure Notes (Addendum)
Anesthesia Regional Block:  Interscalene brachial plexus block  Pre-Anesthetic Checklist: ,, timeout performed, Correct Patient, Correct Site, Correct Laterality, Correct Procedure, Correct Position, site marked, Risks and benefits discussed,  Surgical consent,  Pre-op evaluation,  At surgeon's request and post-op pain management  Laterality: Left  Prep: chloraprep       Needles:   Needle Type: Other     Needle Length: 9cm 9 cm Needle Gauge: 21 and 21 G    Additional Needles:  Procedures: ultrasound guided (picture in chart) Interscalene brachial plexus block Narrative:  Start time: 10/22/2013 7:07 AM End time: 10/22/2013 7:12 AM Injection made incrementally with aspirations every 5 mL.  Performed by: Personally  Anesthesiologist: Aldona Lento Frederick, MD  Additional Notes: Ultrasound guidance used to: id relevant anatomy, confirm needle position, local anesthetic spread, avoidance of vascular puncture. Picture saved. No complications. Block performed personally by Janetta Horaharles E. Gelene MinkFrederick, MD     Procedure Name: Intubation Date/Time: 10/22/2013 7:51 AM Performed by: Elberta LeatherwoodURNER, Tesia Lybrand E Pre-anesthesia Checklist: Patient identified, Emergency Drugs available, Suction available and Patient being monitored Patient Re-evaluated:Patient Re-evaluated prior to inductionOxygen Delivery Method: Circle system utilized Preoxygenation: Pre-oxygenation with 100% oxygen Intubation Type: IV induction Ventilation: Mask ventilation without difficulty Grade View: Grade I Tube type: Oral Tube size: 7.5 mm Number of attempts: 1 Airway Equipment and Method: Stylet and Video-laryngoscopy Placement Confirmation: ETT inserted through vocal cords under direct vision,  positive ETCO2 and breath sounds checked- equal and bilateral Secured at: 21 cm Tube secured with: Tape Dental Injury: Teeth and Oropharynx as per pre-operative assessment

## 2013-10-22 NOTE — Op Note (Signed)
10/22/2013  9:49 AM  PATIENT:  Meagan Mathis    PRE-OPERATIVE DIAGNOSIS:  left shoulder DJD  POST-OPERATIVE DIAGNOSIS:  Same  PROCEDURE:  LEFT TOTAL SHOULDER ARTHROPLASTY  SURGEON:  Eulas PostLANDAU,Rayshun Kandler P, MD  PHYSICIAN ASSISTANT: Janace LittenBrandon Parry, OPA-C, present and scrubbed throughout the case, critical for completion in a timely fashion, and for retraction, instrumentation, and closure.  ANESTHESIA:   General  PREOPERATIVE INDICATIONS:  Meagan CongressRebecca J Timme is a  78 y.o. female with a diagnosis of left shoulder DJD who failed conservative measures and elected for surgical management.    The risks benefits and alternatives were discussed with the patient preoperatively including but not limited to the risks of infection, bleeding, nerve injury, cardiopulmonary complications, the need for revision surgery, dislocation, loosening, incomplete relief of pain, among others, and the patient was willing to proceed.   OPERATIVE IMPLANTS: Biomet size 9 mini press-fit humeral stem, size 46+21 Versa-dial humeral head, set in the A position with increased coverage superiorly, with a small cemented glenoid polyethylene 3 peg implant with a central regenerex noncemented post.   OPERATIVE FINDINGS: Advanced glenohumeral osteoarthritis involving the glenoid and the humeral head with substantial osteophyte formation inferiorly.   OPERATIVE PROCEDURE: The patient was brought to the operating room and placed in the supine position. General anesthesia was administered. IV antibiotics were given.  The upper extremity was prepped and draped in usual sterile fashion. The patient was in a beachchair position with all bony prominences padded. Extra care was taken to position her back, head and neck, due to her back and neck problems.  Time out was performed and a deltopectoral approach was carried out. The biceps tendon was tenodesed to the pectoralis tendon. The subscapularis was released, tagging it with a #2 FiberWire,  leaving a cuff of tendon for repair.   The inferior osteophyte was removed, and release of the capsule off of the humeral side was completed. The head was dislocated, and I reamed sequentially. I placed the humeral cutting guide at 30 of retroversion, and then pinned this into place, and made my humeral neck cut. This was at the appropriate level.   I then placed deep retractors and exposed the glenoid. I excised the labrum circumferentially, taking care to protect the axillary nerve inferiorly.   I then placed a guidewire into the center position, controlling appropriate version and inclination. I then reamed over the guidewire with the small reamer, and was satisfied with the preparation. I preserved the subchondral bone in order to maximize the strength and minimize the risk for subsequent subsidence.   I then drilled the central hole for the regenerex peg, and then placed the guide, and then drilled the 3 peripheral peg holes. I had excellent bony circumferential contact.  All 3 holes had good bony endpoints.  I then cleaned the glenoid, irrigated it copiously, and then dried it and cemented the prosthesis into place. Excellent seating was achieved. I had full exposure. The cement cured, and then I turned my attention to the humeral side.   I sequentially broached, up to the selected size, with the broach set at 30 of retroversion. I then placed the real stem. I trialed with multiple heads, and the above-named component was selected. Increased posterior coverage improved the coverage. The soft tissue tension was appropriate.   I then impacted the real humeral head into place, reduced the head, and irrigated copiously. Excellent stability and range of motion was achieved. I repaired the subscapularis with 4 #2 FiberWire, as well  as the rotator interval, and irrigated copiously once more. The subcutaneous tissue was closed with Vicryl including the deltopectoral fascia.   The skin was closed with  Steri-Strips and sterile gauze was applied. She had a preoperative nerve block. She tolerated the procedure well and there were no complications.

## 2013-10-22 NOTE — H&P (Signed)
PREOPERATIVE H&P  Chief Complaint: left shoulder DJD  HPI: Meagan Mathis is a 78 y.o. female who presents for preoperative history and physical with a diagnosis of left shoulder DJD. Symptoms are rated as moderate to severe, and have been worsening.  This is significantly impairing activities of daily living.  She has elected for surgical management. Failed NSAIDS, injections, activity modification.  Past Medical History  Diagnosis Date  . Scoliosis   . Depression   . Anxiety   . Back pain   . Arthritis   . GERD (gastroesophageal reflux disease)    Past Surgical History  Procedure Laterality Date  . Abdominal hysterectomy    . Tonsillectomy    . Liposuction     History   Social History  . Marital Status: Married    Spouse Name: N/A    Number of Children: N/A  . Years of Education: N/A   Social History Main Topics  . Smoking status: Never Smoker   . Smokeless tobacco: None  . Alcohol Use: 8.4 oz/week    14 Glasses of wine per week  . Drug Use: No  . Sexual Activity: None   Other Topics Concern  . None   Social History Narrative  . None   History reviewed. No pertinent family history. Allergies  Allergen Reactions  . Celebrex [Celecoxib] Diarrhea and Nausea Only  . Chlorine Other (See Comments)    REACTION: Breakouts on body  . Codeine Hives  . Gabapentin Swelling  . Latex Rash   Prior to Admission medications   Medication Sig Start Date End Date Taking? Authorizing Provider  acetaminophen (TYLENOL) 500 MG tablet Take 1,000 mg by mouth every 6 (six) hours as needed.   Yes Historical Provider, MD  Cholecalciferol (VITAMIN D-3) 5000 UNITS TABS Take 5,000 Units by mouth daily.    Yes Historical Provider, MD  clonazePAM (KLONOPIN) 1 MG tablet Take 1.5 mg by mouth at bedtime.   Yes Historical Provider, MD  co-enzyme Q-10 30 MG capsule Take 30 mg by mouth 2 (two) times daily.   Yes Historical Provider, MD  Digestive Enzymes (DIGESTIVE ENZYME PO) Take 1-2  capsules by mouth 3 (three) times daily with meals. Dose depends on components of meal.   Yes Historical Provider, MD  Lysine 500 MG CAPS Take 500 mg by mouth.   Yes Historical Provider, MD  Melatonin 5 MG TABS Take 5 mg by mouth at bedtime.   Yes Historical Provider, MD  methocarbamol (ROBAXIN) 750 MG tablet Take 1,500 mg by mouth 3 (three) times daily.   Yes Historical Provider, MD  niacin 50 MG tablet Take 50 mg by mouth 2 (two) times daily.   Yes Historical Provider, MD  OVER THE COUNTER MEDICATION Take 25 mg by mouth daily. MED NAME: DHEA-F all natural supplement.   Yes Historical Provider, MD  OVER THE COUNTER MEDICATION Take 1 tablet by mouth 2 (two) times daily. MED NAME: Ultra Hair Plus.   Yes Historical Provider, MD  pantoprazole (PROTONIX) 40 MG tablet Take 40 mg by mouth every morning.   Yes Historical Provider, MD  PRESCRIPTION MEDICATION Apply 1 application topically once a week. Compounded testosterone 2% cream. Applies small amount to inner thigh once weekly, on Saturdays or Sundays.   Yes Historical Provider, MD  PRESCRIPTION MEDICATION Apply 1 application topically daily. Compounded estrogen cream 0.1%   Yes Historical Provider, MD  progesterone (PROMETRIUM) 200 MG capsule Take 200 mg by mouth daily.   Yes Historical Provider, MD  Red Yeast Rice Extract (RED YEAST RICE PO) Take 1 tablet by mouth 2 (two) times daily.   Yes Historical Provider, MD  Specialty Vitamins Products (ONE-A-DAY BONE STRENGTH PO) Take 1 tablet by mouth daily.   Yes Historical Provider, MD  thyroid (ARMOUR) 30 MG tablet Take 30 mg by mouth daily before breakfast.   Yes Historical Provider, MD     Positive ROS: All other systems have been reviewed and were otherwise negative with the exception of those mentioned in the HPI and as above.  Physical Exam: General: Alert, no acute distress Cardiovascular: No pedal edema Respiratory: No cyanosis, no use of accessory musculature GI: No organomegaly, abdomen is  soft and non-tender Skin: No lesions in the area of chief complaint Neurologic: Sensation intact distally Psychiatric: Patient is competent for consent with normal mood and affect Lymphatic: No axillary or cervical lymphadenopathy  MUSCULOSKELETAL: 0-80 degrees ff, er 0, crepitance left shoulder.  XR with end stage DJD  Assessment: left shoulder DJD  Plan: Plan for Procedure(s): LEFT TOTAL SHOULDER ARTHROPLASTY  The risks benefits and alternatives were discussed with the patient including but not limited to the risks of nonoperative treatment, versus surgical intervention including infection, bleeding, nerve injury,  blood clots, cardiopulmonary complications, morbidity, mortality, among others, and they were willing to proceed.   Eulas PostLANDAU,Mariavictoria Nottingham P, MD Cell [phone]   10/22/2013 7:15 AM

## 2013-10-22 NOTE — Transfer of Care (Signed)
Immediate Anesthesia Transfer of Care Note  Patient: Meagan CongressRebecca J Mathis  Procedure(s) Performed: Procedure(s): LEFT TOTAL SHOULDER ARTHROPLASTY (Left)  Patient Location: PACU  Anesthesia Type:General and Regional  Level of Consciousness: awake and alert   Airway & Oxygen Therapy: Patient Spontanous Breathing and Patient connected to nasal cannula oxygen  Post-op Assessment: Report given to PACU RN, Post -op Vital signs reviewed and stable and Patient moving all extremities X 4  Post vital signs: Reviewed and stable  Complications: No apparent anesthesia complications

## 2013-10-22 NOTE — Anesthesia Preprocedure Evaluation (Addendum)
Anesthesia Evaluation  Patient identified by MRN, date of birth, ID band Patient awake    Reviewed: Allergy & Precautions, H&P , NPO status , Patient's Chart, lab work & pertinent test results, reviewed documented beta blocker date and time   Airway Mallampati: II TM Distance: >3 FB Neck ROM: full    Dental  (+) Dental Advisory Given, Teeth Intact   Pulmonary neg pulmonary ROS,  breath sounds clear to auscultation        Cardiovascular negative cardio ROS  Rhythm:regular     Neuro/Psych PSYCHIATRIC DISORDERS Anxiety Depression negative neurological ROS     GI/Hepatic Neg liver ROS, GERD-  Medicated and Controlled,  Endo/Other  negative endocrine ROS  Renal/GU negative Renal ROS  negative genitourinary   Musculoskeletal  (+) Arthritis -,   Abdominal   Peds  Hematology negative hematology ROS (+)   Anesthesia Other Findings See surgeon's H&P   Reproductive/Obstetrics negative OB ROS                         Anesthesia Physical Anesthesia Plan  ASA: II  Anesthesia Plan: General and Regional   Post-op Pain Management:    Induction: Intravenous  Airway Management Planned: Oral ETT  Additional Equipment:   Intra-op Plan:   Post-operative Plan: Extubation in OR  Informed Consent: I have reviewed the patients History and Physical, chart, labs and discussed the procedure including the risks, benefits and alternatives for the proposed anesthesia with the patient or authorized representative who has indicated his/her understanding and acceptance.   Dental advisory given  Plan Discussed with: CRNA, Anesthesiologist and Surgeon  Anesthesia Plan Comments:         Anesthesia Quick Evaluation

## 2013-10-22 NOTE — Anesthesia Postprocedure Evaluation (Signed)
  Anesthesia Post Note  Patient: Meagan Mathis  Procedure(s) Performed: Procedure(s) (LRB): LEFT TOTAL SHOULDER ARTHROPLASTY (Left)  Anesthesia type: General  Patient location: PACU  Post pain: Pain level controlled  Post assessment: Patient's Cardiovascular Status Stable  Last Vitals:  Filed Vitals:   10/22/13 1100  BP: 154/59  Pulse: 69  Temp:   Resp: 16    Post vital signs: Reviewed and stable  Level of consciousness: alert  Complications: No apparent anesthesia complications

## 2013-10-22 NOTE — Discharge Instructions (Signed)
Diet: As you were doing prior to hospitalization   Shower:  May shower but keep the wounds dry, use an occlusive plastic wrap, NO SOAKING IN TUB.  If the bandage gets wet, change with a clean dry gauze.  Dressing:  You may change your dressing 3-5 days after surgery.  Then change the dressing daily with sterile gauze dressing.    There are sticky tapes (steri-strips) on your wounds and all the stitches are absorbable.  Leave the steri-strips in place when changing your dressings, they will peel off with time, usually 2-3 weeks.  Activity:  Increase activity slowly as tolerated, but follow the weight bearing instructions below.  No lifting or driving for 6 weeks.  Weight Bearing:   Stay in sling at all times except for hygiene and dressing..    To prevent constipation: you may use a stool softener such as -  Colace (over the counter) 100 mg by mouth twice a day  Drink plenty of fluids (prune juice may be helpful) and high fiber foods Miralax (over the counter) for constipation as needed.    Itching:  If you experience itching with your medications, try taking only a single pain pill, or even half a pain pill at a time.  You may take up to 10 pain pills per day, and you can also use benadryl over the counter for itching or also to help with sleep.   Precautions:  If you experience chest pain or shortness of breath - call 911 immediately for transfer to the hospital emergency department!!  If you develop a fever greater that 101 F, purulent drainage from wound, increased redness or drainage from wound, or calf pain -- Call the office at [phone]                                                Follow- Up Appointment:  Please call for an appointment to be seen in 2 [place] [phone]

## 2013-10-22 NOTE — Preoperative (Signed)
Beta Blockers   Reason not to administer Beta Blockers:Not Applicable 

## 2013-10-22 NOTE — Progress Notes (Signed)
Utilization review completed.  

## 2013-10-23 ENCOUNTER — Encounter (HOSPITAL_COMMUNITY): Payer: Self-pay | Admitting: Orthopedic Surgery

## 2013-10-23 LAB — BASIC METABOLIC PANEL
BUN: 7 mg/dL (ref 6–23)
CHLORIDE: 102 meq/L (ref 96–112)
CO2: 24 meq/L (ref 19–32)
Calcium: 8.3 mg/dL — ABNORMAL LOW (ref 8.4–10.5)
Creatinine, Ser: 0.71 mg/dL (ref 0.50–1.10)
GFR calc Af Amer: >90 mL/min (ref >90)
GFR calc non Af Amer: 82 mL/min — ABNORMAL LOW (ref >90)
Glucose, Bld: 121 mg/dL — ABNORMAL HIGH (ref 70–99)
POTASSIUM: 4.1 meq/L (ref 3.7–5.3)
SODIUM: 138 meq/L (ref 137–147)

## 2013-10-23 LAB — CBC
HCT: 28.4 % — ABNORMAL LOW (ref 36.0–46.0)
Hemoglobin: 9.8 g/dL — ABNORMAL LOW (ref 12.0–15.0)
MCH: 33.4 pg (ref 26.0–34.0)
MCHC: 34.5 g/dL (ref 30.0–36.0)
MCV: 96.9 fL (ref 78.0–100.0)
PLATELETS: 190 10*3/uL (ref 150–400)
RBC: 2.93 MIL/uL — AB (ref 3.87–5.11)
RDW: 13.2 % (ref 11.5–15.5)
WBC: 8 10*3/uL (ref 4.0–10.5)

## 2013-10-23 MED ORDER — OXYCODONE-ACETAMINOPHEN 5-325 MG PO TABS: 1.0000 | ORAL_TABLET | Freq: Four times a day (QID) | ORAL | Status: DC | PRN

## 2013-10-23 NOTE — Progress Notes (Signed)
     Subjective:  Patient reports pain as moderate.  Patient sitting in chair, eating, and requests to go home today.    Objective:   VITALS:   Filed Vitals:   10/22/13 1400 10/22/13 1415 10/22/13 2152 10/23/13 0611  BP: 118/70 141/84 145/62 148/64  Pulse: 77 81 85 82  Temp: 97.4 F (36.3 C) 98.7 F (37.1 C) 98.6 F (37 C) 98.2 F (36.8 C)  TempSrc:      Resp: 17 20 20 20   SpO2: 96% 98% 99% 100%    Neurovascular intact Sensation intact distally Intact pulses distally Incision: scant drainage Sling in place LUE Able to ulnar, radial deviate left wrist.   Able to flex, extend, adduct fingers   Lab Results  Component Value Date   WBC 8.0 10/23/2013   HGB 9.8* 10/23/2013   HCT 28.4* 10/23/2013   MCV 96.9 10/23/2013   PLT 190 10/23/2013     Assessment/Plan: 1 Day Post-Op   Principal Problem:   Osteoarthritis of left shoulder Active Problems:   Primary localized osteoarthrosis, shoulder region   Advance diet Up with therapy Discharge home today NWB LUE Sling at all times Clean, dry dressing prn Follow up in office in two weeks Mild ABLA, observed  Torrie MayersDOUGLAS PARRY, BRANDON 10/23/2013, 7:57 AM   Teryl LucyJoshua Anayia Eugene, MD Cell [phone]

## 2013-10-23 NOTE — Evaluation (Signed)
Physical Therapy Evaluation Patient Details Name: Meagan Mathis MRN: [id number] DOB: 09/01/1935 Today's Date: 10/23/2013 Time: 1000-1015 PT Time Calculation (min): 15 min  PT Assessment / Plan / Recommendation History of Present Illness  pt s/p L total shoulder arthroplasty  Clinical Impression  Pt presents at baseline level of functioning, mod I with mobility and gait and stairs, no further PT services needed at this time    PT Assessment  Patent does not need any further PT services    Follow Up Recommendations  No PT follow up    Does the patient have the potential to tolerate intense rehabilitation      Barriers to Discharge        Equipment Recommendations  None recommended by PT    Recommendations for Other Services     Frequency      Precautions / Restrictions Precautions Precautions: Shoulder Required Braces or Orthoses: Sling   Pertinent Vitals/Pain Pt c/o L shoulder soreness, eases with positioning and ice pack      Mobility  Bed Mobility Overal bed mobility: Modified Independent General bed mobility comments: raises HOB, states she has tempurpedic bed at home Transfers Overall transfer level: Modified independent Equipment used: None General transfer comment: increased time Ambulation/Gait Ambulation/Gait assistance: Modified independent (Device/Increase time) Ambulation Distance (Feet): 150 Feet Assistive device: None Gait velocity interpretation: Below normal speed for age/gender General Gait Details: cautious gait, no LOB, pt holds to wall when available, states this is what she does at home Stairs: Yes Stairs assistance: Modified independent (Device/Increase time) Stair Management: One rail Right Number of Stairs: 5    Exercises     PT Diagnosis:    PT Problem List:   PT Treatment Interventions:       PT Goals(Current goals can be found in the care plan section) Acute Rehab PT Goals PT Goal Formulation: No goals set, d/c  therapy  Visit Information  Last PT Received On: 10/23/13 Assistance Needed: +1 History of Present Illness: pt s/p L total shoulder arthroplasty       Prior Functioning  Home Living Family/patient expects to be discharged to:: Private residence Living Arrangements: Spouse/significant other Available Help at Discharge: Family;Available 24 hours/day Type of Home: House Home Access: Stairs to enter Entergy CorporationEntrance Stairs-Number of Steps: 2 Entrance Stairs-Rails: Can reach both Home Equipment: Cane - quad Prior Function Level of Independence: Independent with assistive device(s) Comments: occasionally uses cane Communication Communication: No difficulties    Cognition  Cognition Arousal/Alertness: Awake/alert Behavior During Therapy: WFL for tasks assessed/performed Overall Cognitive Status: Within Functional Limits for tasks assessed    Extremity/Trunk Assessment Upper Extremity Assessment Upper Extremity Assessment: Defer to OT evaluation Lower Extremity Assessment Lower Extremity Assessment: Generalized weakness Cervical / Trunk Assessment Cervical / Trunk Assessment: Normal   Balance    End of Session PT - End of Session Equipment Utilized During Treatment:  (L sling) Activity Tolerance: Patient tolerated treatment well Patient left: in bed;with family/visitor present;with call bell/phone within reach Nurse Communication: Mobility status  GP     Meagan Mathis 10/23/2013, 10:18 AM

## 2013-10-23 NOTE — Progress Notes (Signed)
Occupational Therapy Evaluation Patient Details Name: BLAKELYNN SEKELSKY MRN: [id number] DOB: [birth year] Today's Date: 10/23/2013 Time: 2841-3244 OT Time Calculation (min): 39 min  OT Assessment / Plan / Recommendation History of present illness pt s/p L total shoulder arthroplasty   Clinical Impression   Completed all education regarding Dr. Shelba Flake TSA protocol. Pt/family demonstrate ability to follow instructions. Handout given. Pt ready for D/C. To follow up with Dr.Landau and progress with therapy as indicated.     OT Assessment  All further OT needs can be met in the next venue of care    Follow Up Recommendations  Outpatient OT;Other (comment) (when appropriate)    Barriers to Discharge      Equipment Recommendations  None recommended by OT    Recommendations for Other Services Other (comment) (follow up with Dr. Dion Saucier and progress with OT as indicated)  Frequency       Precautions / Restrictions Precautions Precautions: Shoulder Required Braces or Orthoses: Sling Restrictions Weight Bearing Restrictions: Yes LUE Weight Bearing: Non weight bearing   Pertinent Vitals/Pain 6/10 premedicated    ADL  Upper Body Bathing: Moderate assistance Where Assessed - Upper Body Bathing: Supported sitting Lower Body Bathing: Moderate assistance Where Assessed - Lower Body Bathing: Supported sit to stand Upper Body Dressing: Maximal assistance Where Assessed - Upper Body Dressing: Supported sitting Lower Body Dressing: Moderate assistance Where Assessed - Lower Body Dressing: Supported sit to stand Toilet Transfer: Lobbyist: Comfort height toilet Toileting - Architect and Hygiene: Minimal assistance Where Assessed - Engineer, mining and Hygiene: Sit to stand from 3-in-1 or toilet Transfers/Ambulation Related to ADLs: S ADL Comments: Pt/family educated on compensatory techniques  Pt/husband able to complete ADL  independently after instruction. All questions answered.    OT Diagnosis: Generalized weakness;Acute pain  OT Problem List: Decreased strength;Decreased range of motion;Decreased coordination;Decreased knowledge of precautions;Pain;Impaired UE functional use OT Treatment Interventions:     OT Goals(Current goals can be found in the care plan section) Acute Rehab OT Goals Patient Stated Goal: to be in less pain OT Goal Formulation: With patient (eval only)  Visit Information  Last OT Received On: 10/23/13 Assistance Needed: +1 History of Present Illness: pt s/p L total shoulder arthroplasty       Prior Functioning     Home Living Family/patient expects to be discharged to:: Private residence Living Arrangements: Spouse/significant other Available Help at Discharge: Family;Available 24 hours/day Type of Home: House Home Access: Stairs to enter [employer] of Steps: 2 Entrance Stairs-Rails: Can reach both Home Layout: One level Home Equipment: Cane - quad;Hand held shower head;Shower seat - built in Prior Function Level of Independence: Independent with assistive device(s) Comments: occasionally uses cane Communication Communication: No difficulties Dominant Hand: Right         Vision/Perception     Cognition  Cognition Arousal/Alertness: Awake/alert Behavior During Therapy: WFL for tasks assessed/performed Overall Cognitive Status: Within Functional Limits for tasks assessed    Extremity/Trunk Assessment Upper Extremity Assessment Upper Extremity Assessment: LUE deficits/detail LUE Deficits / Details: s/p L TSA Lower Extremity Assessment Lower Extremity Assessment: Generalized weakness Cervical / Trunk Assessment Cervical / Trunk Assessment: Other exceptions (hx of back trouble)     Mobility Bed Mobility Overal bed mobility: Modified Independent (has movable bed at home) General bed mobility comments: raises HOB, states she has tempurpedic bed at  home Transfers Overall transfer level: Modified independent Equipment used: None General transfer comment: increased time     Exercise  Shoulder Exercises Elbow Flexion: AROM;AAROM;Left;10 reps;Standing Elbow Extension: AROM;AAROM;Left;5 reps;Standing Wrist Flexion: AROM;Left;5 reps Wrist Extension: AROM;Left;5 reps Digit Composite Flexion: AROM;Left;5 reps Composite Extension: AROM;Left;5 reps Donning/doffing shirt without moving shoulder: Caregiver independent with task;Patient able to independently direct caregiver Method for sponge bathing under operated UE: Caregiver independent with task;Patient able to independently direct caregiver Donning/doffing sling/immobilizer: Caregiver independent with task;Patient able to independently direct caregiver Correct positioning of sling/immobilizer: Caregiver independent with task;Patient able to independently direct caregiver ROM for elbow, wrist and digits of operated UE: Patient able to independently direct caregiver;Modified independent Sling wearing schedule (on at all times/off for ADL's): Caregiver independent with task;Patient able to independently direct caregiver;Independent Proper positioning of operated UE when showering: Independent;Caregiver independent with task;Patient able to independently direct caregiver Positioning of UE while sleeping: Caregiver independent with task;Patient able to independently direct caregiver;Independent   Balance     End of Session OT - End of Session Activity Tolerance: Patient limited by pain Patient left: in bed;with call bell/phone within reach;with family/visitor present Nurse Communication: Mobility status;Weight bearing status;Precautions;Other (comment) (ready for D/C)  GO     Astin Rape,HILLARY 10/23/2013, 11:27 AM Luisa Dago, OTR/L  [phone] 10/23/2013

## 2013-10-23 NOTE — Care Management Note (Signed)
CARE MANAGEMENT NOTE 10/23/2013  Patient:  Meagan Mathis,Meagan Mathis   Account Number:  [id number]  Date Initiated:  10/23/2013  Documentation initiated by:  Vance PeperBRADY,Aubreanna Percle  Subjective/Objective Assessment:   78 yr old female s/p left total shoulder arthroplasty.     Action/Plan:   No home health or DME needs identified.   Anticipated DC Date:  10/23/2013   Anticipated DC Plan:  HOME/SELF CARE      DC Planning Services  CM consult      PAC Choice  NA   Choice offered to / List presented to:     DME arranged  NA        HH arranged  NA      Status of service:  Completed, signed off Medicare Important Message given?   (If response is "NO", the following Medicare IM given date fields will be blank) Date Medicare IM given:   Date Additional Medicare IM given:    Discharge Disposition:  HOME/SELF CARE

## 2013-10-23 NOTE — Discharge Summary (Addendum)
Physician Discharge Summary  Patient ID: Meagan Mathis MRN: [id number] DOB/AGE: [birth year] 76 y.o.  Admit date: 10/22/2013 Discharge date: 10/23/2013  Admission Diagnoses:  Osteoarthritis of left shoulder  Discharge Diagnoses:  Principal Problem:   Osteoarthritis of left shoulder Active Problems:   Primary localized osteoarthrosis, shoulder region   Past Medical History  Diagnosis Date  . Scoliosis   . Depression   . Anxiety   . Back pain     "back is collapsing from the neck down" (10/22/2013)  . GERD (gastroesophageal reflux disease)   . High cholesterol   . Pneumonia 1957  . Sleep deprivation     "chronic" (10/22/2013)  . Hypothyroidism   . H/O hiatal hernia   . Arthritis     "loaded" (10/22/2013)  . Osteoarthritis of left shoulder 10/22/2013  . Fibromyalgia     Surgeries: Procedure(s): LEFT TOTAL SHOULDER ARTHROPLASTY on 10/22/2013   Consultants (if any):    Discharged Condition: Improved  Hospital Course: Meagan Mathis is an 78 y.o. female who was admitted 10/22/2013 with a diagnosis of Osteoarthritis of left shoulder and went to the operating room on 10/22/2013 and underwent the above named procedures.    She was given perioperative antibiotics:  Anti-infectives   Start     Dose/Rate Route Frequency Ordered Stop   10/22/13 1500  ceFAZolin (ANCEF) IVPB 1 g/50 mL premix     1 g 100 mL/hr over 30 Minutes Intravenous Every 6 hours 10/22/13 1439 10/23/13 0428   10/22/13 0600  ceFAZolin (ANCEF) IVPB 2 g/50 mL premix     2 g 100 mL/hr over 30 Minutes Intravenous On call to O.R. 10/21/13 1413 10/22/13 0755    .  She was given sequential compression devices, early ambulation for DVT prophylaxis.  She benefited maximally from the hospital stay and there were no complications.    Recent vital signs:  Filed Vitals:   10/23/13 0611  BP: 148/64  Pulse: 82  Temp: 98.2 F (36.8 C)  Resp: 20    Recent laboratory studies:  Lab Results  Component Value Date    HGB 9.8* 10/23/2013   HGB 14.1 10/17/2013   HGB 13.5 04/17/2012   Lab Results  Component Value Date   WBC 8.0 10/23/2013   PLT 190 10/23/2013   No results found for this basename: INR   Lab Results  Component Value Date   NA 138 10/23/2013   K 4.1 10/23/2013   CL 102 10/23/2013   CO2 24 10/23/2013   BUN 7 10/23/2013   CREATININE 0.71 10/23/2013   GLUCOSE 121* 10/23/2013    Discharge Medications:     Medication List    STOP taking these medications       acetaminophen 500 MG tablet  Commonly known as:  TYLENOL      TAKE these medications       clonazePAM 1 MG tablet  Commonly known as:  KLONOPIN  Take 1.5 mg by mouth at bedtime.     co-enzyme Q-10 30 MG capsule  Take 30 mg by mouth 2 (two) times daily.     DIGESTIVE ENZYME PO  Take 1-2 capsules by mouth 3 (three) times daily with meals. Dose depends on components of meal.     Lysine 500 MG Caps  Take 500 mg by mouth.     Melatonin 5 MG Tabs  Take 5 mg by mouth at bedtime.     methocarbamol 750 MG tablet  Commonly known as:  ROBAXIN  Take 1,500  mg by mouth 3 (three) times daily.     niacin 50 MG tablet  Take 50 mg by mouth 2 (two) times daily.     ondansetron 4 MG tablet  Commonly known as:  ZOFRAN  Take 1 tablet (4 mg total) by mouth every 8 (eight) hours as needed for nausea or vomiting.     ONE-A-DAY BONE STRENGTH PO  Take 1 tablet by mouth daily.     OVER THE COUNTER MEDICATION  Take 25 mg by mouth daily. MED NAME: DHEA-F all natural supplement.     OVER THE COUNTER MEDICATION  Take 1 tablet by mouth 2 (two) times daily. MED NAME: Ultra Hair Plus.     oxyCODONE-acetaminophen 5-325 MG per tablet  Commonly known as:  ROXICET  Take 1-2 tablets by mouth every 6 (six) hours as needed for severe pain.     pantoprazole 40 MG tablet  Commonly known as:  PROTONIX  Take 40 mg by mouth every morning.     PRESCRIPTION MEDICATION  Apply 1 application topically once a week. Compounded testosterone 2% cream.  Applies small amount to inner thigh once weekly, on Saturdays or Sundays.     PRESCRIPTION MEDICATION  Apply 1 application topically daily. Compounded estrogen cream 0.1%     progesterone 200 MG capsule  Commonly known as:  PROMETRIUM  Take 200 mg by mouth daily.     RED YEAST RICE PO  Take 1 tablet by mouth 2 (two) times daily.     sennosides-docusate sodium 8.6-50 MG tablet  Commonly known as:  SENOKOT-S  Take 2 tablets by mouth daily.     thyroid 30 MG tablet  Commonly known as:  ARMOUR  Take 30 mg by mouth daily before breakfast.     Vitamin D-3 5000 UNITS Tabs  Take 5,000 Units by mouth daily.        Diagnostic Studies: Dg Shoulder Left  10/22/2013   CLINICAL DATA:  Left total shoulder arthroplasty  EXAM: LEFT SHOULDER - 1 VIEW PORTABLE  COMPARISON:  Portable exam 1035 hr correlated with prior MRI of 06/15/2013  FINDINGS: Osseous demineralization.  AC joint alignment normal.  Left shoulder prosthesis identified in expected position on single AP view.  No acute fracture dislocation  IMPRESSION: Left shoulder prosthesis without acute complication.   Electronically Signed   By: Ulyses Southward M.D.   On: 10/22/2013 10:48    Disposition: 01-Home or Self Care      Discharge Orders   Future Orders Complete By Expires   Call MD / Call 911  As directed    Comments:     If you experience chest pain or shortness of breath, CALL 911 and be transported to the hospital emergency room.  If you develope a fever above 101 F, pus (white drainage) or increased drainage or redness at the wound, or calf pain, call your surgeon's office.   Constipation Prevention  As directed    Comments:     Drink plenty of fluids.  Prune juice may be helpful.  You may use a stool softener, such as Colace (over the counter) 100 mg twice a day.  Use MiraLax (over the counter) for constipation as needed.   Diet general  As directed    Discharge instructions  As directed    Comments:     Change dressing in 3  days and reapply fresh dressing, unless you have a splint (half cast).  If you have a splint/cast, just leave in place until  your follow-up appointment.    Keep wounds dry for 3 weeks.  Leave steri-strips in place on skin.  Do not apply lotion or anything to the wound.   OT splint  As directed 10/22/2014   Comments:     Ok for elbow, wrist, and hand motion.  Sling at all times except for hygiene, no shoulder activity until 6 weeks.      Follow-up Information   Follow up with Eulas Post, MD. Schedule an appointment as soon as possible for a visit in 2 weeks.   Specialty:  Orthopedic Surgery   Contact information:   [address] [place] [address] [phone]        Signed: Eulas Post 10/23/2013, 8:24 AM

## 2014-10-11 ENCOUNTER — Emergency Department (HOSPITAL_BASED_OUTPATIENT_CLINIC_OR_DEPARTMENT_OTHER): Payer: Medicare Other

## 2014-10-11 ENCOUNTER — Encounter (HOSPITAL_BASED_OUTPATIENT_CLINIC_OR_DEPARTMENT_OTHER): Payer: Self-pay

## 2014-10-11 ENCOUNTER — Emergency Department (HOSPITAL_BASED_OUTPATIENT_CLINIC_OR_DEPARTMENT_OTHER)
Admission: EM | Admit: 2014-10-11 | Discharge: 2014-10-11 | Disposition: A | Discharge status: Home / Self Care | Payer: Medicare Other | Attending: Emergency Medicine | Admitting: Emergency Medicine

## 2014-10-11 DIAGNOSIS — M19012 Primary osteoarthritis, left shoulder: Secondary | ICD-10-CM | POA: Diagnosis not present

## 2014-10-11 DIAGNOSIS — Z9889 Other specified postprocedural states: Secondary | ICD-10-CM | POA: Diagnosis not present

## 2014-10-11 DIAGNOSIS — E860 Dehydration: Secondary | ICD-10-CM

## 2014-10-11 DIAGNOSIS — F419 Anxiety disorder, unspecified: Secondary | ICD-10-CM | POA: Insufficient documentation

## 2014-10-11 DIAGNOSIS — Z87891 Personal history of nicotine dependence: Secondary | ICD-10-CM | POA: Insufficient documentation

## 2014-10-11 DIAGNOSIS — R05 Cough: Secondary | ICD-10-CM | POA: Insufficient documentation

## 2014-10-11 DIAGNOSIS — R1110 Vomiting, unspecified: Secondary | ICD-10-CM | POA: Insufficient documentation

## 2014-10-11 DIAGNOSIS — M546 Pain in thoracic spine: Secondary | ICD-10-CM | POA: Diagnosis not present

## 2014-10-11 DIAGNOSIS — M549 Dorsalgia, unspecified: Secondary | ICD-10-CM

## 2014-10-11 DIAGNOSIS — G8918 Other acute postprocedural pain: Secondary | ICD-10-CM | POA: Insufficient documentation

## 2014-10-11 DIAGNOSIS — K219 Gastro-esophageal reflux disease without esophagitis: Secondary | ICD-10-CM | POA: Diagnosis not present

## 2014-10-11 DIAGNOSIS — Z8701 Personal history of pneumonia (recurrent): Secondary | ICD-10-CM | POA: Diagnosis not present

## 2014-10-11 DIAGNOSIS — E871 Hypo-osmolality and hyponatremia: Secondary | ICD-10-CM | POA: Diagnosis not present

## 2014-10-11 DIAGNOSIS — E039 Hypothyroidism, unspecified: Secondary | ICD-10-CM | POA: Diagnosis not present

## 2014-10-11 DIAGNOSIS — R0602 Shortness of breath: Secondary | ICD-10-CM | POA: Insufficient documentation

## 2014-10-11 DIAGNOSIS — R001 Bradycardia, unspecified: Secondary | ICD-10-CM | POA: Diagnosis not present

## 2014-10-11 DIAGNOSIS — Z72820 Sleep deprivation: Secondary | ICD-10-CM | POA: Insufficient documentation

## 2014-10-11 DIAGNOSIS — M419 Scoliosis, unspecified: Secondary | ICD-10-CM | POA: Insufficient documentation

## 2014-10-11 DIAGNOSIS — I959 Hypotension, unspecified: Secondary | ICD-10-CM | POA: Diagnosis not present

## 2014-10-11 DIAGNOSIS — E780 Pure hypercholesterolemia: Secondary | ICD-10-CM | POA: Diagnosis not present

## 2014-10-11 DIAGNOSIS — Z91040 Latex allergy status: Secondary | ICD-10-CM | POA: Insufficient documentation

## 2014-10-11 DIAGNOSIS — R112 Nausea with vomiting, unspecified: Secondary | ICD-10-CM

## 2014-10-11 DIAGNOSIS — Z79899 Other long term (current) drug therapy: Secondary | ICD-10-CM | POA: Insufficient documentation

## 2014-10-11 LAB — COMPREHENSIVE METABOLIC PANEL
ALBUMIN: 3.4 g/dL — AB (ref 3.5–5.2)
ALT: 12 U/L (ref 0–35)
ANION GAP: 6 (ref 5–15)
AST: 20 U/L (ref 0–37)
Alkaline Phosphatase: 51 U/L (ref 39–117)
BUN: 16 mg/dL (ref 6–23)
CALCIUM: 8.1 mg/dL — AB (ref 8.4–10.5)
CO2: 25 mmol/L (ref 19–32)
CREATININE: 0.95 mg/dL (ref 0.50–1.10)
Chloride: 93 mmol/L — ABNORMAL LOW (ref 96–112)
GFR, EST AFRICAN AMERICAN: 66 mL/min — AB (ref >90)
GFR, EST NON AFRICAN AMERICAN: 57 mL/min — AB (ref >90)
Glucose, Bld: 163 mg/dL — ABNORMAL HIGH (ref 70–99)
Potassium: 4.2 mmol/L (ref 3.5–5.1)
SODIUM: 124 mmol/L — AB (ref 135–145)
Total Bilirubin: 0.6 mg/dL (ref 0.3–1.2)
Total Protein: 5.9 g/dL — ABNORMAL LOW (ref 6.0–8.3)

## 2014-10-11 LAB — CBC WITH DIFFERENTIAL/PLATELET
BASOS ABS: 0 10*3/uL (ref 0.0–0.1)
Basophils Relative: 0 % (ref 0–1)
EOS PCT: 3 % (ref 0–5)
Eosinophils Absolute: 0.2 10*3/uL (ref 0.0–0.7)
HEMATOCRIT: 34.1 % — AB (ref 36.0–46.0)
Hemoglobin: 11.6 g/dL — ABNORMAL LOW (ref 12.0–15.0)
LYMPHS ABS: 1.2 10*3/uL (ref 0.7–4.0)
Lymphocytes Relative: 17 % (ref 12–46)
MCH: 31.8 pg (ref 26.0–34.0)
MCHC: 34 g/dL (ref 30.0–36.0)
MCV: 93.4 fL (ref 78.0–100.0)
Monocytes Absolute: 0.6 10*3/uL (ref 0.1–1.0)
Monocytes Relative: 9 % (ref 3–12)
NEUTROS ABS: 5 10*3/uL (ref 1.7–7.7)
Neutrophils Relative %: 71 % (ref 43–77)
Platelets: 189 10*3/uL (ref 150–400)
RBC: 3.65 MIL/uL — ABNORMAL LOW (ref 3.87–5.11)
RDW: 12 % (ref 11.5–15.5)
WBC: 7 10*3/uL (ref 4.0–10.5)

## 2014-10-11 LAB — URINALYSIS, ROUTINE W REFLEX MICROSCOPIC
Bilirubin Urine: NEGATIVE
Glucose, UA: NEGATIVE mg/dL
HGB URINE DIPSTICK: NEGATIVE
KETONES UR: NEGATIVE mg/dL
LEUKOCYTES UA: NEGATIVE
Nitrite: NEGATIVE
PH: 7 (ref 5.0–8.0)
PROTEIN: NEGATIVE mg/dL
SPECIFIC GRAVITY, URINE: 1.014 (ref 1.005–1.030)
Urobilinogen, UA: 0.2 mg/dL (ref 0.0–1.0)

## 2014-10-11 LAB — TROPONIN I: Troponin I: <0.03 ng/mL (ref <0.031)

## 2014-10-11 LAB — I-STAT CG4 LACTIC ACID, ED: Lactic Acid, Venous: 1.68 mmol/L (ref 0.5–2.0)

## 2014-10-11 MED ORDER — SODIUM CHLORIDE 0.9 % IV BOLUS (SEPSIS)
1000.0000 mL | Freq: Once | INTRAVENOUS | Status: AC
  Administered 2014-10-11: 1000 mL via INTRAVENOUS

## 2014-10-11 MED ORDER — SODIUM CHLORIDE 0.9 % IV SOLN
1.0000 g | Freq: Once | INTRAVENOUS | Status: AC
  Administered 2014-10-11: 1 g via INTRAVENOUS
  Filled 2014-10-11: qty 10

## 2014-10-11 MED ORDER — IOHEXOL 350 MG/ML SOLN
100.0000 mL | Freq: Once | INTRAVENOUS | Status: AC | PRN
Start: 2014-10-11 — End: 2014-10-11
  Administered 2014-10-11: 100 mL via INTRAVENOUS

## 2014-10-11 NOTE — ED Notes (Addendum)
Patient here for increased back pain and increased soreness after having a spinal procedure for disc, reports that she had cement injected into spine. Now having pain when she sits and makes her feel shortness of breath. Patient reports that she feels so dry and having a hard time catching her breath, speaking complete sentences on assessment

## 2014-10-11 NOTE — ED Provider Notes (Signed)
CSN: [id number]     Arrival date & time 10/11/14  1050 History   First MD Initiated Contact with Patient 10/11/14 1149     Chief Complaint  Patient presents with  . back pain-hypotension      (Consider location/radiation/quality/duration/timing/severity/associated sxs/prior Treatment) HPI Comments: 79 year-old female with significant osteoarthritis and degeneration history presents with back pain, near-syncope and hypotension. Patient had cementing procedure of thoracic vertebrae #5 yesterday afternoon by Dr. Noel Gerold, no issues during procedure except for pain. Patient has had recurrent vomiting and worsening back pain since then. Pain is in the similar location to previous however more severe. No specific radiation. No chest pain, no shortness of breath however patient feels mild difficulty taking deep breath.Patient denies blood clot history, active cancer, recent major trauma or surgery, unilateral leg swelling/ pain, recent long travel patient denies any cardiac history, no unilateral leg swelling. No leg weakness numbness or cold sensation. No AAA history. Pain with palpation and breathing. Patient denies active bleeding anywhere..Patient has been taking her pain medicines hysterectomy.     The history is provided by the patient.    Past Medical History  Diagnosis Date  . Scoliosis   . Depression   . Anxiety   . Back pain     "back is collapsing from the neck down" (10/22/2013)  . GERD (gastroesophageal reflux disease)   . High cholesterol   . Pneumonia 1957  . Sleep deprivation     "chronic" (10/22/2013)  . Hypothyroidism   . H/O hiatal hernia   . Arthritis     "loaded" (10/22/2013)  . Osteoarthritis of left shoulder 10/22/2013  . Fibromyalgia    Past Surgical History  Procedure Laterality Date  . Tonsillectomy    . Liposuction  1980's  . Total shoulder arthroplasty Left 10/22/2013  . Abdominal hysterectomy  ~ 1970  . Appendectomy  ~ 1970  . Cataract extraction w/ intraocular  lens  implant, bilateral Bilateral 4-12/2012  . Total shoulder arthroplasty Left 10/22/2013    Procedure: LEFT TOTAL SHOULDER ARTHROPLASTY;  Surgeon: Eulas Post, MD;  Location: [facility] OR;  Service: Orthopedics;  Laterality: Left;   No family history on file. History  Substance Use Topics  . Smoking status: Former Smoker -- 1.50 packs/day for 11 years    Types: Cigarettes  . Smokeless tobacco: Never Used     Comment: 10/22/2013 "smoked cigarettes til age 71 or 36"  . Alcohol Use: 8.4 oz/week    14 Glasses of wine per week   OB History    No data available     Review of Systems  Constitutional: Positive for appetite change. Negative for fever and chills.  HENT: Negative for congestion.   Eyes: Negative for visual disturbance.  Respiratory: Positive for cough and shortness of breath.   Cardiovascular: Negative for chest pain.  Gastrointestinal: Positive for nausea and vomiting. Negative for abdominal pain.  Genitourinary: Negative for dysuria and flank pain.  Musculoskeletal: Positive for back pain. Negative for neck pain and neck stiffness.  Skin: Negative for rash.  Neurological: Positive for weakness (gen) and light-headedness. Negative for headaches.      Allergies  Celebrex; Chlorine; Codeine; Gabapentin; and Latex  Home Medications   Prior to Admission medications   Medication Sig Start Date End Date Taking? Authorizing Provider  Cholecalciferol (VITAMIN D-3) 5000 UNITS TABS Take 5,000 Units by mouth daily.     Historical Provider, MD  clonazePAM (KLONOPIN) 1 MG tablet Take 1.5 mg by mouth at bedtime.  Historical Provider, MD  co-enzyme Q-10 30 MG capsule Take 30 mg by mouth 2 (two) times daily.    Historical Provider, MD  Digestive Enzymes (DIGESTIVE ENZYME PO) Take 1-2 capsules by mouth 3 (three) times daily with meals. Dose depends on components of meal.    Historical Provider, MD  Lysine 500 MG CAPS Take 500 mg by mouth.    Historical Provider, MD  Melatonin 5 MG  TABS Take 5 mg by mouth at bedtime.    Historical Provider, MD  methocarbamol (ROBAXIN) 750 MG tablet Take 1,500 mg by mouth 3 (three) times daily.    Historical Provider, MD  niacin 50 MG tablet Take 50 mg by mouth 2 (two) times daily.    Historical Provider, MD  ondansetron (ZOFRAN) 4 MG tablet Take 1 tablet (4 mg total) by mouth every 8 (eight) hours as needed for nausea or vomiting. 10/22/13   Eulas Post, MD  OVER THE COUNTER MEDICATION Take 25 mg by mouth daily. MED NAME: DHEA-F all natural supplement.    Historical Provider, MD  OVER THE COUNTER MEDICATION Take 1 tablet by mouth 2 (two) times daily. MED NAME: Ultra Hair Plus.    Historical Provider, MD  oxyCODONE-acetaminophen (ROXICET) 5-325 MG per tablet Take 1-2 tablets by mouth every 6 (six) hours as needed for severe pain. 10/23/13   Eulas Post, MD  pantoprazole (PROTONIX) 40 MG tablet Take 40 mg by mouth every morning.    Historical Provider, MD  PRESCRIPTION MEDICATION Apply 1 application topically once a week. Compounded testosterone 2% cream. Applies small amount to inner thigh once weekly, on Saturdays or Sundays.    Historical Provider, MD  PRESCRIPTION MEDICATION Apply 1 application topically daily. Compounded estrogen cream 0.1%    Historical Provider, MD  progesterone (PROMETRIUM) 200 MG capsule Take 200 mg by mouth daily.    Historical Provider, MD  Red Yeast Rice Extract (RED YEAST RICE PO) Take 1 tablet by mouth 2 (two) times daily.    Historical Provider, MD  sennosides-docusate sodium (SENOKOT-S) 8.6-50 MG tablet Take 2 tablets by mouth daily. 10/22/13   Eulas Post, MD  Specialty Vitamins Products (ONE-A-DAY BONE STRENGTH PO) Take 1 tablet by mouth daily.    Historical Provider, MD  thyroid (ARMOUR) 30 MG tablet Take 30 mg by mouth daily before breakfast.    Historical Provider, MD   BP 125/54 mmHg  Pulse 58  Temp(Src) 97.7 F (36.5 C) (Oral)  Resp 15  Wt 156 lb (70.761 kg)  SpO2 97% Physical Exam   Constitutional: She is oriented to person, place, and time. She appears well-developed and well-nourished.  HENT:  Head: Normocephalic and atraumatic.  Dry mucous membranes  Eyes: Conjunctivae are normal. Right eye exhibits no discharge. Left eye exhibits no discharge.  Neck: Normal range of motion. Neck supple. No tracheal deviation present.  Cardiovascular: Regular rhythm and intact distal pulses.  Bradycardia present.   Pulmonary/Chest: Effort normal and breath sounds normal.  Abdominal: Soft. She exhibits no distension. There is no tenderness. There is no guarding.  Musculoskeletal: She exhibits tenderness. She exhibits no edema.  Patient has tenderness right upper paraspinal thoracic region, mild swelling and tenderness to palpation. No spreading erythema or induration at site of injection yesterday.  Neurological: She is alert and oriented to person, place, and time. GCS eye subscore is 4. GCS verbal subscore is 5. GCS motor subscore is 6.  Patient has general weakness and fatigue appearance. Patient has 5+ strength upper and  lower extremities equal bilateral. Patient has 5+ strength with right and left hip flexion, foot dorsiflexion, knee flexion. Sensation intact palpation in lower extremities equal bilateral.  Skin: Skin is warm. No rash noted. There is pallor.  Psychiatric: She has a normal mood and affect.  Nursing note and vitals reviewed.   ED Course  Procedures (including critical care time)   EMERGENCY DEPARTMENT US CARDIAC EXAM "Study: Limited Ultrasound of the heart and pericardium"  INDICATIONS:Hypotension Multiple views of the heart and pericardium were obtained in real-time with a multi-frequency probe.  PERFORMED VQ:[id number]:Myself  IMAGES ARCHIVED?: Yes  FINDINGS: No pericardial effusion, Hyperdynamic contractility, IVC normal and Tamponade physiology absent  LIMITATIONS:  Emergent procedure  VIEWS USED: Subcostal 4 chamber, Parasternal long axis, Parasternal short  axis, Apical 4 chamber  and Inferior Vena Cava  INTERPRETATION: Cardiac activity present, Pericardial effusioin absent, Cardiac tamponade absent, Volume status normal and Normal contractility  Ultrasound limited abdominal and limited transthoracic ultrasound (FAST)  Indication: hypotension Four views were obtained using the low frequency transducer: Splenorenal, Hepatorenal, Retrovesical, Pericardial subxyphoid Interpretation: No free fluid visualized surrounding the kidneys, pelvis or pericardium. Images archived electronically Dr. Jodi MourningZavitz personally performed and interpreted the images EMERGENCY DEPARTMENT ULTRASOUND  Study: Limited Retroperitoneal Ultrasound of the Abdominal Aorta.  INDICATIONS:Hypotension Multiple views of the abdominal aorta were obtained in real-time from the diaphragmatic hiatus to the aortic bifurcation in transverse planes with a multi-frequency probe. PERFORMED BY: Myself IMAGES ARCHIVED?: Yes FINDINGS: Free fluid absent LIMITATIONS:  Bowel gas INTERPRETATION:  No abdominal aortic aneurysm   CRITICAL CARE Performed by: Enid SkeensZAVITZ, Dashawn Bartnick M   Total critical care time: 75 min  Critical care time was exclusive of separately billable procedures and treating other patients.  Critical care was necessary to treat or prevent imminent or life-threatening deterioration.  Critical care was time spent personally by me on the following activities: development of treatment plan with patient and/or surrogate as well as nursing, discussions with consultants, evaluation of patient's response to treatment, examination of patient, obtaining history from patient or surrogate, ordering and performing treatments and interventions, ordering and review of laboratory studies, ordering and review of radiographic studies, pulse oximetry and re-evaluation of patient's condition.  Labs Review Labs Reviewed  CBC WITH DIFFERENTIAL/PLATELET - Abnormal; Notable for the following:    RBC  3.65 (*)    Hemoglobin 11.6 (*)    HCT 34.1 (*)    All other components within normal limits  COMPREHENSIVE METABOLIC PANEL - Abnormal; Notable for the following:    Sodium 124 (*)    Chloride 93 (*)    Glucose, Bld 163 (*)    Calcium 8.1 (*)    Total Protein 5.9 (*)    Albumin 3.4 (*)    GFR calc non Af Amer 57 (*)    GFR calc Af Amer 66 (*)    All other components within normal limits  URINE CULTURE  TROPONIN I  URINALYSIS, ROUTINE W REFLEX MICROSCOPIC  I-STAT CG4 LACTIC ACID, ED  I-STAT CG4 LACTIC ACID, ED    Imaging Review Ct Angio Chest Pe W/cm &/or Wo Cm  10/11/2014   CLINICAL DATA:  Shortness of breath. Chest and back pain. Hypotension.  EXAM: CT ANGIOGRAPHY CHEST, ABDOMEN AND PELVIS  TECHNIQUE: Multidetector CT imaging through the chest, abdomen and pelvis was performed using the standard protocol during bolus administration of intravenous contrast. Multiplanar reconstructed images and MIPs were obtained and reviewed to evaluate the vascular anatomy.  CONTRAST:  100mL OMNIPAQUE IOHEXOL 350 MG/ML  SOLN  COMPARISON:  None.  FINDINGS: CTA CHEST FINDINGS  Mediastinum/Lymph Nodes: Satisfactory opacification of pulmonary arteries is noted, and no pulmonary embolism identified. No evidence of thoracic aortic dissection or aneurysm. No masses or pathologically enlarged lymph nodes identified.  Lungs/Pleura: No pulmonary infiltrate or mass identified. No effusion present.  Musculoskeletal/Soft Tissues: No suspicious bone lesions or other significant chest wall abnormality. Vertebroplasty noted at T5 and T9 vertebral body compression fracture is noted which appears old. Left shoulder prosthesis creates streak artifact in the upper thorax.  Review of the MIP images confirms the above findings.  CTA ABDOMEN AND PELVIS FINDINGS  Hepatobiliary: No masses or other significant abnormality identified. Multiple tiny calcified gallstones are seen however there is no evidence of cholecystitis or biliary  dilatation.  Pancreas: No mass, inflammatory changes, or other significant abnormality identified.  Spleen:  Within normal limits in size and appearance.  Adrenals:  No masses identified.  Kidneys/Urinary Tract:  No evidence of masses or hydronephrosis.  Stomach/Bowel/Peritoneum: No evidence of wall thickening, mass, or obstruction. Diverticulosis is seen predominately involving the sigmoid colon, however there is no evidence of diverticulitis. No other inflammatory process or abnormal fluid collections identified.  Vascular/Lymphatic: No pathologically enlarged lymph nodes identified. No evidence of aneurysm or dissection involving the abdominal aorta or iliac arteries. No evidence of retroperitoneal hemorrhage.  Reproductive: No mass or other significant abnormality identified. Prior hysterectomy noted.  Other:  None.  Musculoskeletal:  No suspicious bone lesions identified.  Review of the MIP images confirms the above findings.  IMPRESSION: No evidence of aortic aneurysm or dissection. No evidence of pulmonary embolism.  Prior T5 vertebroplasty, and probable old T9 vertebral body compression fracture deformity.  Diverticulosis. No radiographic evidence of diverticulitis.  Cholelithiasis.  No radiographic evidence of cholecystitis.   Electronically Signed   By: Myles Rosenthal M.D.   On: 10/11/2014 13:14   Dg Chest Port 1 View  10/11/2014   CLINICAL DATA:  Procedure performed yesterday. Procedure involved injection of cement into her lower spine. Pain and shortness of breath. Shortness of breath increases when getting up. History of pneumonia.  EXAM: PORTABLE CHEST - 1 VIEW  COMPARISON:  MRI 10/10/2014  FINDINGS: Heart size is within normal limits. Radiopaque material overlies the upper thoracic spine, consistent with T5 injection. Lungs are clear. There are no focal consolidations or pleural effusions. No pulmonary edema. Patient has had previous left shoulder arthroplasty.  IMPRESSION: 1.  No evidence for acute  cardiopulmonary abnormality. 2. Radiopaque material in the general vicinity of T5.   Electronically Signed   By: Norva Pavlov M.D.   On: 10/11/2014 12:12   Ct Cta Abd/pel W/cm &/or W/o Cm  10/11/2014   CLINICAL DATA:  Shortness of breath. Chest and back pain. Hypotension.  EXAM: CT ANGIOGRAPHY CHEST, ABDOMEN AND PELVIS  TECHNIQUE: Multidetector CT imaging through the chest, abdomen and pelvis was performed using the standard protocol during bolus administration of intravenous contrast. Multiplanar reconstructed images and MIPs were obtained and reviewed to evaluate the vascular anatomy.  CONTRAST:  OMNIPAQUE IOHEXOL 350 MG/ML SOLN  COMPARISON:  None.  FINDINGS: CTA CHEST FINDINGS  Mediastinum/Lymph Nodes: Satisfactory opacification of pulmonary arteries is noted, and no pulmonary embolism identified. No evidence of thoracic aortic dissection or aneurysm. No masses or pathologically enlarged lymph nodes identified.  Lungs/Pleura: No pulmonary infiltrate or mass identified. No effusion present.  Musculoskeletal/Soft Tissues: No suspicious bone lesions or other significant chest wall abnormality. Vertebroplasty noted at T5 and T9 vertebral body  compression fracture is noted which appears old. Left shoulder prosthesis creates streak artifact in the upper thorax.  Review of the MIP images confirms the above findings.  CTA ABDOMEN AND PELVIS FINDINGS  Hepatobiliary: No masses or other significant abnormality identified. Multiple tiny calcified gallstones are seen however there is no evidence of cholecystitis or biliary dilatation.  Pancreas: No mass, inflammatory changes, or other significant abnormality identified.  Spleen:  Within normal limits in size and appearance.  Adrenals:  No masses identified.  Kidneys/Urinary Tract:  No evidence of masses or hydronephrosis.  Stomach/Bowel/Peritoneum: No evidence of wall thickening, mass, or obstruction. Diverticulosis is seen predominately involving the sigmoid  colon, however there is no evidence of diverticulitis. No other inflammatory process or abnormal fluid collections identified.  Vascular/Lymphatic: No pathologically enlarged lymph nodes identified. No evidence of aneurysm or dissection involving the abdominal aorta or iliac arteries. No evidence of retroperitoneal hemorrhage.  Reproductive: No mass or other significant abnormality identified. Prior hysterectomy noted.  Other:  None.  Musculoskeletal:  No suspicious bone lesions identified.  Review of the MIP images confirms the above findings.  IMPRESSION: No evidence of aortic aneurysm or dissection. No evidence of pulmonary embolism.  Prior T5 vertebroplasty, and probable old T9 vertebral body compression fracture deformity.  Diverticulosis. No radiographic evidence of diverticulitis.  Cholelithiasis.  No radiographic evidence of cholecystitis.   Electronically Signed   By: Myles Rosenthal M.D.   On: 10/11/2014 13:14     EKG Interpretation   Date/Time:  Saturday October 11 2014 11:51:39 EST Ventricular Rate:  51 PR Interval:  162 QRS Duration: 90 QT Interval:  498 QTC Calculation: 458 R Axis:   43 Text Interpretation:  Sinus bradycardia Otherwise normal ECG Confirmed by  Andrena Margerum  MD, Derel Mcglasson ([id number]) on 10/11/2014 12:16:35 PM      MDM   Final diagnoses:  Acute back pain  Hyponatremia  Hypocalcemia  Hypotension, unspecified hypotension type  Dehydration  Non-intractable vomiting with nausea, vomiting of unspecified type  Acute thoracic back pain   Patient presents with worsening back pain, near-syncope and hypotension. Patient's blood pressure 60 systolic on arrival, bradycardic. Patient has no chest pain, EKG bradycardic reviewed no acute ST elevation, no heart block appreciated. Patient has no classic blood clot risk factors. With recent procedure mild short of breath worsening pain plan for CT angina the chest and abdomen. Bedside ultrasound no obvious AAA seen, no free fluid in the  abdomen, no significant pericardial effusion. Patient is DO NOT RESUSCITATE and wishes to be transferred to [facility]. 2 IVs, 2 L fluid bolus.    Patient improved significantly with IV fluids and aggressive resuscitation. CT scan results reviewed no acute findings. Discussed with patient's specialist on the phone who will follow along. Paged medicine for transfer for further treatment of hypotension and hyponatremia.  The patients results and plan were reviewed and discussed.   Any x-rays performed were personally reviewed by myself.   Differential diagnosis were considered with the presenting HPI.  Medications  sodium chloride 0.9 % bolus 1,000 mL (0 mLs Intravenous Stopped 10/11/14 1247)  sodium chloride 0.9 % bolus 1,000 mL (0 mLs Intravenous Stopped 10/11/14 1247)  calcium gluconate 1 g in sodium chloride 0.9 % 100 mL IVPB (1 g Intravenous New Bag/Given 10/11/14 1255)  iohexol (OMNIPAQUE) 350 MG/ML injection 100 mL (100 mLs Intravenous Contrast Given 10/11/14 1244)    Filed Vitals:   10/11/14 1159 10/11/14 1300 10/11/14 1303 10/11/14 1336  BP: 93/49 129/62 129/62 125/54  Pulse: 52 58 59 58  Temp:      TempSrc:      Resp: Weight:      SpO2: 98% 96% 98% 97%    Final diagnoses:  Acute back pain  Hyponatremia  Hypocalcemia  Hypotension, unspecified hypotension type  Dehydration  Non-intractable vomiting with nausea, vomiting of unspecified type  Acute thoracic back pain    Admission/ observation were discussed with the admitting physician, patient and/or family and they are comfortable with the plan.   After discussing with [facility] hospitalist patient changed her mind and refused admission/transfer. Patient feels improved in ER and wishes to follow-up with her primary doctor. Patient has nausea meds at home and will try to stay hydrated at home. Patient understands myself and her surgeon recommended transfer for further evaluation and treatment, patient has capacity  to make decisions and family in the room.      Enid Skeens, MD 10/11/14 [id number]

## 2014-10-11 NOTE — Discharge Instructions (Signed)
[facility] or see another physician if you change your mind or have worsening symptoms.  Stay well-hydrated.  If you were given medicines take as directed.  If you are on coumadin or contraceptives realize their levels and effectiveness is altered by many different medicines.  If you have any reaction (rash, tongues swelling, other) to the medicines stop taking and see a physician.   Please follow up as directed and return to the ER or see a physician for new or worsening symptoms.  Thank you. Filed Vitals:   10/11/14 1159 10/11/14 1300 10/11/14 1303 10/11/14 1336  BP: 93/49 129/62 129/62 125/54  Pulse: 52 58 59 58  Temp:      TempSrc:      Resp: 12 12 14 15   Weight:      SpO2: 98% 96% 98% 97%

## 2014-10-11 NOTE — ED Notes (Signed)
Patient transported to CT 

## 2014-10-11 NOTE — ED Notes (Signed)
Steri-strips noted on back, minimal swelling noted with dried blood to same.

## 2014-10-11 NOTE — ED Notes (Signed)
MD at bedside. 

## 2014-10-11 NOTE — ED Notes (Signed)
Had a robaxin this am, and pain medication

## 2014-10-12 LAB — URINE CULTURE

## 2014-12-18 ENCOUNTER — Encounter (HOSPITAL_BASED_OUTPATIENT_CLINIC_OR_DEPARTMENT_OTHER): Payer: Self-pay | Admitting: *Deleted

## 2014-12-18 ENCOUNTER — Emergency Department (HOSPITAL_BASED_OUTPATIENT_CLINIC_OR_DEPARTMENT_OTHER)
Admission: EM | Admit: 2014-12-18 | Discharge: 2014-12-18 | Disposition: A | Discharge status: Home / Self Care | Payer: Medicare Other | Attending: Emergency Medicine | Admitting: Emergency Medicine

## 2014-12-18 DIAGNOSIS — K219 Gastro-esophageal reflux disease without esophagitis: Secondary | ICD-10-CM | POA: Diagnosis not present

## 2014-12-18 DIAGNOSIS — E039 Hypothyroidism, unspecified: Secondary | ICD-10-CM | POA: Insufficient documentation

## 2014-12-18 DIAGNOSIS — M1990 Unspecified osteoarthritis, unspecified site: Secondary | ICD-10-CM | POA: Diagnosis not present

## 2014-12-18 DIAGNOSIS — L259 Unspecified contact dermatitis, unspecified cause: Secondary | ICD-10-CM | POA: Diagnosis not present

## 2014-12-18 DIAGNOSIS — F329 Major depressive disorder, single episode, unspecified: Secondary | ICD-10-CM | POA: Insufficient documentation

## 2014-12-18 DIAGNOSIS — Z87891 Personal history of nicotine dependence: Secondary | ICD-10-CM | POA: Diagnosis not present

## 2014-12-18 DIAGNOSIS — Z91040 Latex allergy status: Secondary | ICD-10-CM | POA: Diagnosis not present

## 2014-12-18 DIAGNOSIS — Z8701 Personal history of pneumonia (recurrent): Secondary | ICD-10-CM | POA: Diagnosis not present

## 2014-12-18 DIAGNOSIS — R21 Rash and other nonspecific skin eruption: Secondary | ICD-10-CM | POA: Diagnosis present

## 2014-12-18 DIAGNOSIS — Z79899 Other long term (current) drug therapy: Secondary | ICD-10-CM | POA: Insufficient documentation

## 2014-12-18 DIAGNOSIS — F419 Anxiety disorder, unspecified: Secondary | ICD-10-CM | POA: Insufficient documentation

## 2014-12-18 MED ORDER — PREDNISONE 10 MG PO TABS: ORAL_TABLET | ORAL | Status: DC

## 2014-12-18 NOTE — ED Notes (Signed)
Rash on her forehead since this am. Worse after putting cream on it.

## 2014-12-18 NOTE — Discharge Instructions (Signed)

## 2014-12-18 NOTE — ED Provider Notes (Signed)
CSN: [id number]     Arrival date & time 12/18/14  1709 History   First MD Initiated Contact with Patient 12/18/14 1739     Chief Complaint  Patient presents with  . Rash     (Consider location/radiation/quality/duration/timing/severity/associated sxs/prior Treatment) HPI Comments: Pt comes in with c/o rash to the forehead that started today. State that the area is itchy. Hasn't tried any new medications, detergents or medication. Hasn't tried anything. Denies any pain.no history of similar symptoms  The history is provided by the patient. No language interpreter was used.    Past Medical History  Diagnosis Date  . Scoliosis   . Depression   . Anxiety   . Back pain     "back is collapsing from the neck down" (10/22/2013)  . GERD (gastroesophageal reflux disease)   . High cholesterol   . Pneumonia 1957  . Sleep deprivation     "chronic" (10/22/2013)  . Hypothyroidism   . H/O hiatal hernia   . Arthritis     "loaded" (10/22/2013)  . Osteoarthritis of left shoulder 10/22/2013  . Fibromyalgia    Past Surgical History  Procedure Laterality Date  . Tonsillectomy    . Liposuction  1980's  . Total shoulder arthroplasty Left 10/22/2013  . Abdominal hysterectomy  ~ 1970  . Appendectomy  ~ 1970  . Cataract extraction w/ intraocular lens  implant, bilateral Bilateral 4-12/2012  . Total shoulder arthroplasty Left 10/22/2013    Procedure: LEFT TOTAL SHOULDER ARTHROPLASTY;  Surgeon: Eulas PostJoshua P Landau, MD;  Location: [facility] OR;  Service: Orthopedics;  Laterality: Left;   No family history on file. History  Substance Use Topics  . Smoking status: Former Smoker -- 1.50 packs/day for 11 years    Types: Cigarettes  . Smokeless tobacco: Never Used     Comment: 10/22/2013 "smoked cigarettes til age 79 or 5932"  . Alcohol Use: 8.4 oz/week    14 Glasses of wine per week   OB History    No data available     Review of Systems  All other systems reviewed and are negative.     Allergies   Celebrex; Chlorine; Codeine; Gabapentin; and Latex  Home Medications   Prior to Admission medications   Medication Sig Start Date End Date Taking? Authorizing Provider  Escitalopram Oxalate (LEXAPRO PO) Take by mouth.   Yes Historical Provider, MD  LORazepam (ATIVAN PO) Take by mouth.   Yes Historical Provider, MD  Cholecalciferol (VITAMIN D-3) 5000 UNITS TABS Take 5,000 Units by mouth daily.     Historical Provider, MD  clonazePAM (KLONOPIN) 1 MG tablet Take 1.5 mg by mouth at bedtime.    Historical Provider, MD  co-enzyme Q-10 30 MG capsule Take 30 mg by mouth 2 (two) times daily.    Historical Provider, MD  Digestive Enzymes (DIGESTIVE ENZYME PO) Take 1-2 capsules by mouth 3 (three) times daily with meals. Dose depends on components of meal.    Historical Provider, MD  Lysine 500 MG CAPS Take 500 mg by mouth.    Historical Provider, MD  Melatonin 5 MG TABS Take 5 mg by mouth at bedtime.    Historical Provider, MD  methocarbamol (ROBAXIN) 750 MG tablet Take 1,500 mg by mouth 3 (three) times daily.    Historical Provider, MD  niacin 50 MG tablet Take 50 mg by mouth 2 (two) times daily.    Historical Provider, MD  ondansetron (ZOFRAN) 4 MG tablet Take 1 tablet (4 mg total) by mouth every 8 (eight)  hours as needed for nausea or vomiting. 10/22/13   Teryl Lucy, MD  OVER THE COUNTER MEDICATION Take 25 mg by mouth daily. MED NAME: DHEA-F all natural supplement.    Historical Provider, MD  OVER THE COUNTER MEDICATION Take 1 tablet by mouth 2 (two) times daily. MED NAME: Ultra Hair Plus.    Historical Provider, MD  oxyCODONE-acetaminophen (ROXICET) 5-325 MG per tablet Take 1-2 tablets by mouth every 6 (six) hours as needed for severe pain. 10/23/13   Teryl Lucy, MD  pantoprazole (PROTONIX) 40 MG tablet Take 40 mg by mouth every morning.    Historical Provider, MD  predniSONE (DELTASONE) 10 MG tablet 6 day stepdown dose 12/18/14   Teressa Lower, NP  PRESCRIPTION MEDICATION Apply 1 application  topically once a week. Compounded testosterone 2% cream. Applies small amount to inner thigh once weekly, on Saturdays or Sundays.    Historical Provider, MD  PRESCRIPTION MEDICATION Apply 1 application topically daily. Compounded estrogen cream 0.1%    Historical Provider, MD  progesterone (PROMETRIUM) 200 MG capsule Take 200 mg by mouth daily.    Historical Provider, MD  Red Yeast Rice Extract (RED YEAST RICE PO) Take 1 tablet by mouth 2 (two) times daily.    Historical Provider, MD  sennosides-docusate sodium (SENOKOT-S) 8.6-50 MG tablet Take 2 tablets by mouth daily. 10/22/13   Teryl Lucy, MD  Specialty Vitamins Products (ONE-A-DAY BONE STRENGTH PO) Take 1 tablet by mouth daily.    Historical Provider, MD  thyroid (ARMOUR) 30 MG tablet Take 30 mg by mouth daily before breakfast.    Historical Provider, MD   BP 126/61 mmHg  Pulse 72  Temp(Src) 98.3 F (36.8 C) (Oral)  Resp 20  Ht  (1.6 m)  Wt 144 lb (65.318 kg)  BMI 25.51 kg/m2  SpO2 98% Physical Exam  Constitutional: She is oriented to person, place, and time. She appears well-developed and well-nourished.  HENT:  Right Ear: External ear normal.  Left Ear: External ear normal.  Mouth/Throat: Oropharynx is clear and moist.  Eyes: Conjunctivae and EOM are normal. Pupils are equal, round, and reactive to light.  Cardiovascular: Normal rate and regular rhythm.   Pulmonary/Chest: Effort normal and breath sounds normal.  Neurological: She is alert and oriented to person, place, and time.  Skin:  Macular papular erythematous rash to the forehead. No drainage. No vesicles  Psychiatric: She has a normal mood and affect.  Nursing note and vitals reviewed.   ED Course  Procedures (including critical care time) Labs Review Labs Reviewed - No data to display  Imaging Review No results found.   EKG Interpretation None      MDM   Final diagnoses:  Contact dermatitis    Not consistent with shingles. Will treat with  prednisone dose back for contact dermatitis    Teressa Lower, NP 12/18/14 1858  Elwin Mocha, MD 12/19/14 [phone]

## 2016-03-23 IMAGING — CT CT CTA ABD/PEL W/CM AND/OR W/O CM
2 of 7 series · 17 of 36 positions shown · IV contrast (APPLIED)
Comparison: None.

CLINICAL DATA: Shortness of breath. Chest and back pain.
Hypotension.

EXAM:
CT ANGIOGRAPHY CHEST, ABDOMEN AND PELVIS
TECHNIQUE: Multidetector CT imaging through the chest, abdomen and pelvis was
performed using the standard protocol during bolus administration of
intravenous contrast. Multiplanar reconstructed images and MIPs were
obtained and reviewed to evaluate the vascular anatomy.
CONTRAST:  100mL OMNIPAQUE IOHEXOL 350 MG/ML SOLN

[Series 5: pe 1.0 b26f · axial · 0.75mm/px · z∈[-714,-112]mm · 16 of 678 slices shown]
[im 38/678  lung]
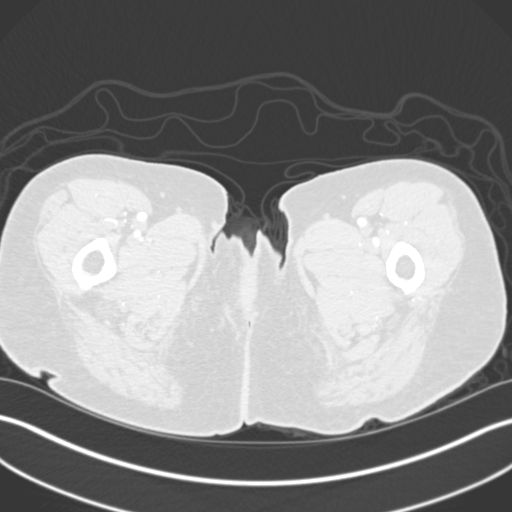
[im 76/678  mediastinal]
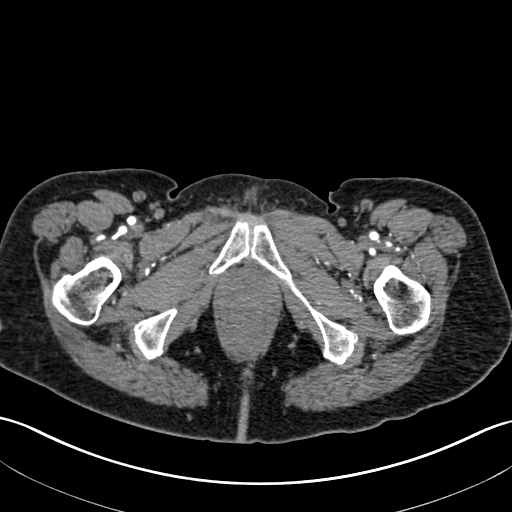
[im 113/678  lung]
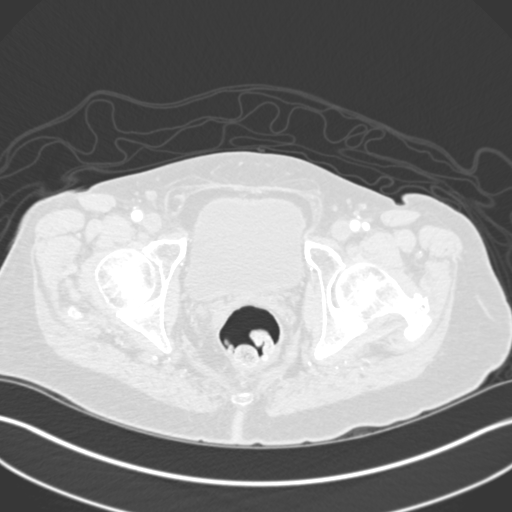
[im 151/678  mediastinal]
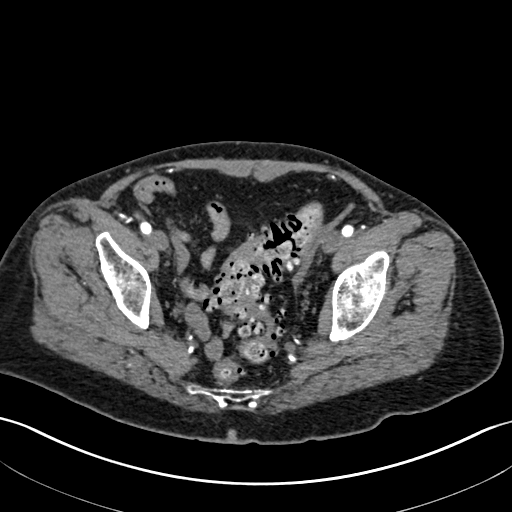
[im 189/678  lung]
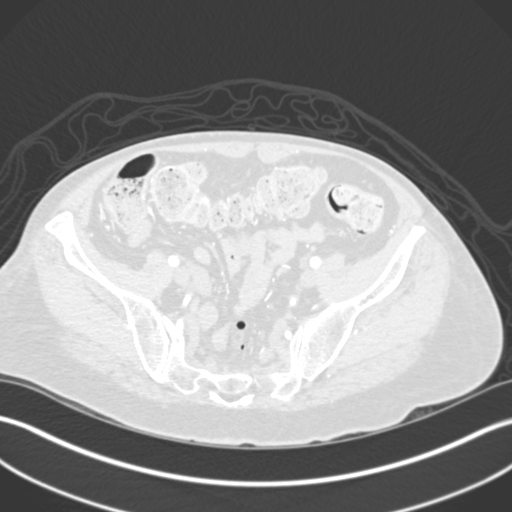
[im 226/678  mediastinal]
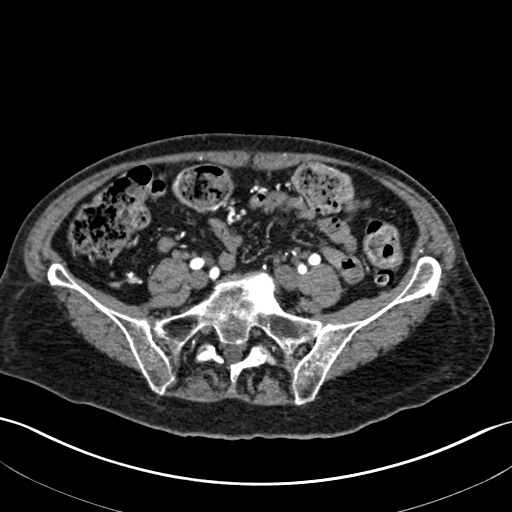
[im 264/678  lung]
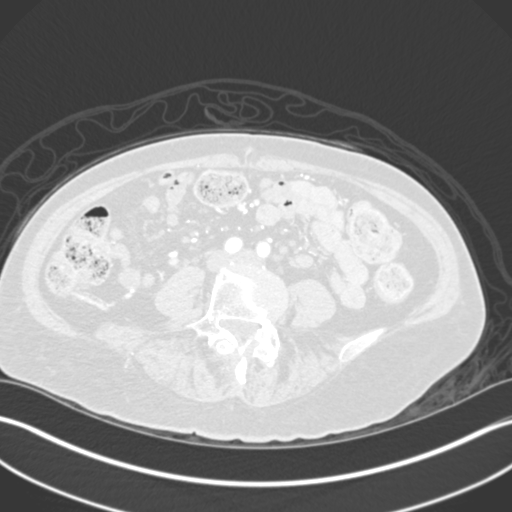
[im 301/678  mediastinal]
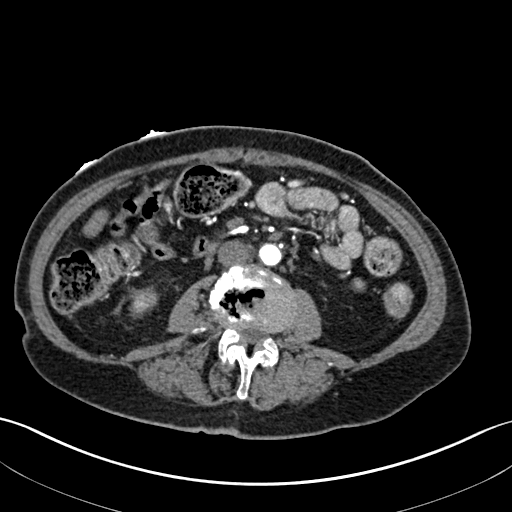
[im 377/678  lung]
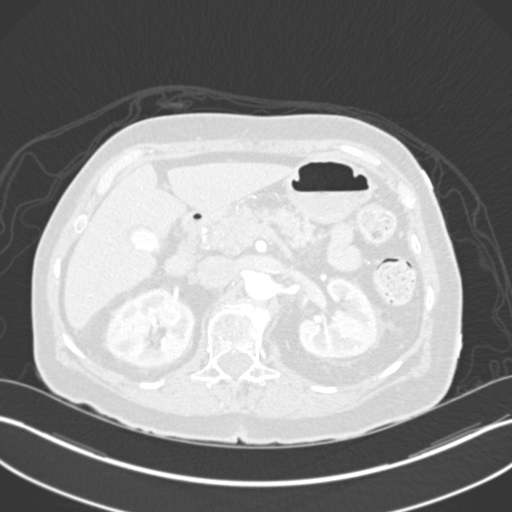
[im 414/678  mediastinal]
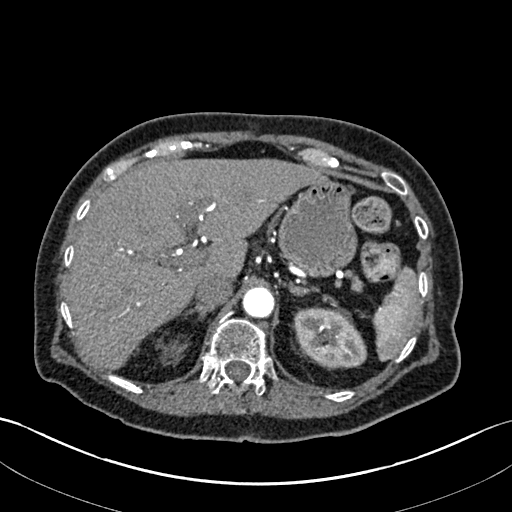
[im 452/678  lung]
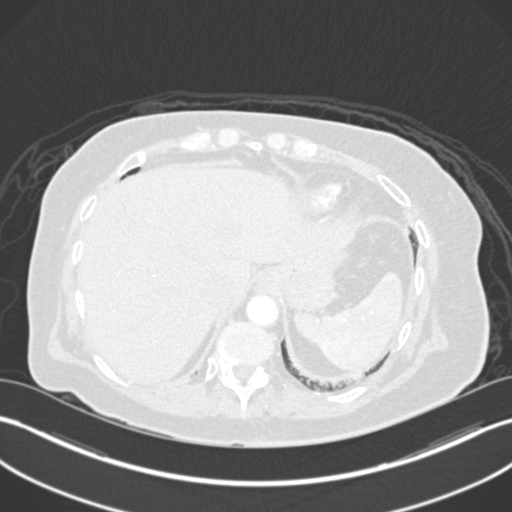
[im 489/678  mediastinal]
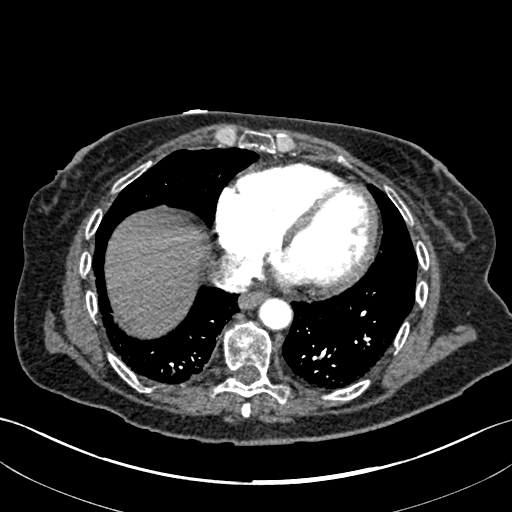
[im 527/678  lung]
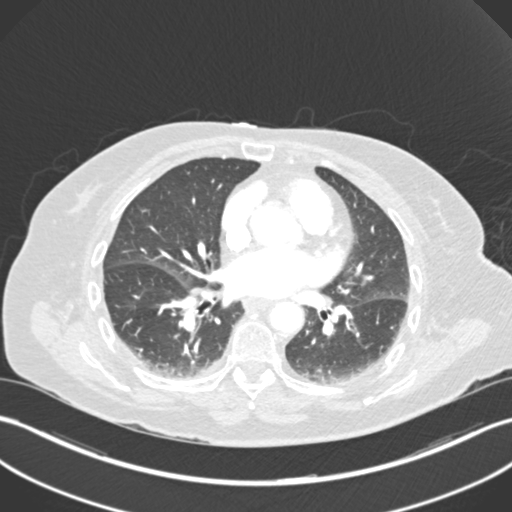
[im 565/678  mediastinal]
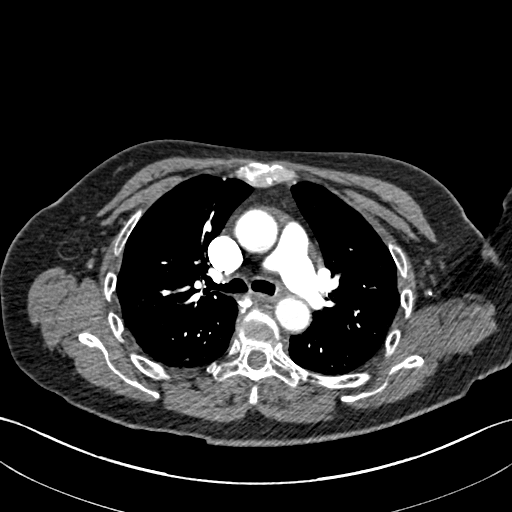
[im 602/678  lung]
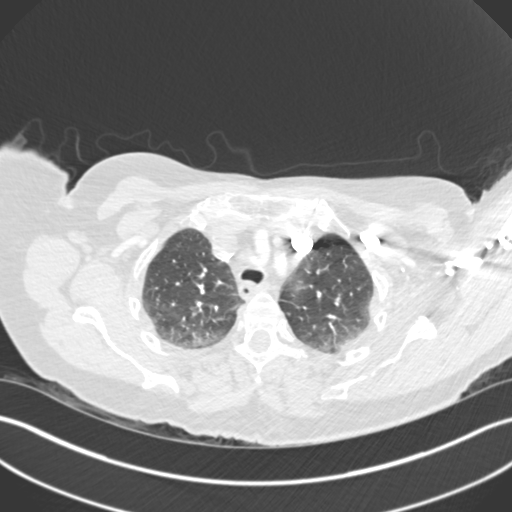
[im 640/678  mediastinal]
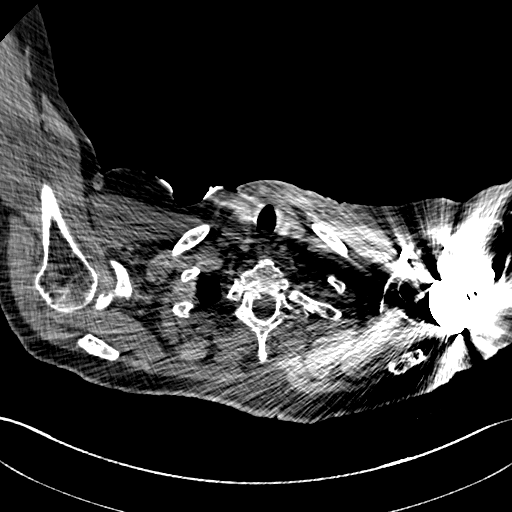

[Series 8: pe 2.0 coronal · coronal · 0.78mm/px · 1 of 101 slices shown]
[im 51/101  mediastinal]
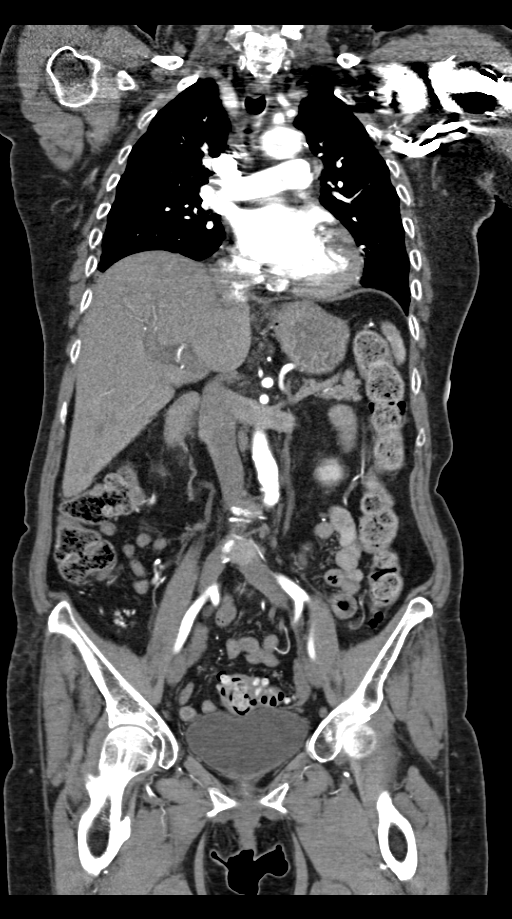

[17 of 36 positions shown; findings below may reference images not displayed]

FINDINGS: CTA CHEST FINDINGS

Mediastinum/Lymph Nodes: Satisfactory opacification of pulmonary
arteries is noted, and no pulmonary embolism identified. No evidence
of thoracic aortic dissection or aneurysm. No masses or
pathologically enlarged lymph nodes identified.

Lungs/Pleura: No pulmonary infiltrate or mass identified. No
effusion present.

Musculoskeletal/Soft Tissues: No suspicious bone lesions or other
significant chest wall abnormality. Vertebroplasty noted at T5 and
T9 vertebral body compression fracture is noted which appears old.
Left shoulder prosthesis creates streak artifact in the upper
thorax.

Review of the MIP images confirms the above findings.

CTA ABDOMEN AND PELVIS FINDINGS

Hepatobiliary: No masses or other significant abnormality
identified. Multiple tiny calcified gallstones are seen however
there is no evidence of cholecystitis or biliary dilatation.

Pancreas: No mass, inflammatory changes, or other significant
abnormality identified.

Spleen:  Within normal limits in size and appearance.

Adrenals:  No masses identified.

Kidneys/Urinary Tract:  No evidence of masses or hydronephrosis.

Stomach/Bowel/Peritoneum: No evidence of wall thickening, mass, or
obstruction. Diverticulosis is seen predominately involving the
sigmoid colon, however there is no evidence of diverticulitis. No
other inflammatory process or abnormal fluid collections identified.

Vascular/Lymphatic: No pathologically enlarged lymph nodes
identified. No evidence of aneurysm or dissection involving the
abdominal aorta or iliac arteries. No evidence of retroperitoneal
hemorrhage.

Reproductive: No mass or other significant abnormality identified.
Prior hysterectomy noted.

Other:  None.

Musculoskeletal:  No suspicious bone lesions identified.

Review of the MIP images confirms the above findings.
IMPRESSION: No evidence of aortic aneurysm or dissection. No evidence of
pulmonary embolism.

Prior T5 vertebroplasty, and probable old T9 vertebral body
compression fracture deformity.

Diverticulosis. No radiographic evidence of diverticulitis.

Cholelithiasis.  No radiographic evidence of cholecystitis.

## 2016-07-01 ENCOUNTER — Emergency Department (HOSPITAL_BASED_OUTPATIENT_CLINIC_OR_DEPARTMENT_OTHER)
Admission: EM | Admit: 2016-07-01 | Discharge: 2016-07-01 | Disposition: A | Discharge status: Home / Self Care | Payer: Medicare Other | Attending: Emergency Medicine | Admitting: Emergency Medicine

## 2016-07-01 ENCOUNTER — Encounter (HOSPITAL_BASED_OUTPATIENT_CLINIC_OR_DEPARTMENT_OTHER): Payer: Self-pay | Admitting: *Deleted

## 2016-07-01 ENCOUNTER — Emergency Department (HOSPITAL_BASED_OUTPATIENT_CLINIC_OR_DEPARTMENT_OTHER): Payer: Medicare Other

## 2016-07-01 DIAGNOSIS — J181 Lobar pneumonia, unspecified organism: Secondary | ICD-10-CM | POA: Diagnosis not present

## 2016-07-01 DIAGNOSIS — Z87891 Personal history of nicotine dependence: Secondary | ICD-10-CM | POA: Insufficient documentation

## 2016-07-01 DIAGNOSIS — Z91040 Latex allergy status: Secondary | ICD-10-CM | POA: Diagnosis not present

## 2016-07-01 DIAGNOSIS — E039 Hypothyroidism, unspecified: Secondary | ICD-10-CM | POA: Insufficient documentation

## 2016-07-01 DIAGNOSIS — J189 Pneumonia, unspecified organism: Secondary | ICD-10-CM

## 2016-07-01 DIAGNOSIS — Z79899 Other long term (current) drug therapy: Secondary | ICD-10-CM | POA: Diagnosis not present

## 2016-07-01 DIAGNOSIS — R05 Cough: Secondary | ICD-10-CM | POA: Diagnosis present

## 2016-07-01 MED ORDER — BENZONATATE 100 MG PO CAPS
100.0000 mg | ORAL_CAPSULE | Freq: Once | ORAL | Status: AC
  Administered 2016-07-01: 100 mg via ORAL
  Filled 2016-07-01: qty 1

## 2016-07-01 MED ORDER — BENZONATATE 100 MG PO CAPS: 100.0000 mg | ORAL_CAPSULE | Freq: Three times a day (TID) | ORAL | 0 refills | Status: DC | PRN

## 2016-07-01 MED ORDER — AZITHROMYCIN 250 MG PO TABS: 250.0000 mg | ORAL_TABLET | Freq: Every day | ORAL | 0 refills | Status: DC

## 2016-07-01 MED ORDER — AZITHROMYCIN 250 MG PO TABS
500.0000 mg | ORAL_TABLET | Freq: Once | ORAL | Status: AC
  Administered 2016-07-01: 500 mg via ORAL
  Filled 2016-07-01: qty 2

## 2016-07-01 MED FILL — AZITHROMYCIN 250 MG TABLET: 250 | 4 days supply | Qty: 4 | Fill #0

## 2016-07-01 MED FILL — BENZONATATE 100 MG CAPSULE: 100 | 7 days supply | Qty: 21 | Fill #0

## 2016-07-01 NOTE — ED Provider Notes (Signed)
[facility] Provider Note   CSN: [id number] Arrival date & time: 07/01/16  [id number]     History   Chief Complaint Chief Complaint  Patient presents with  . Cough    HPI Meagan Mathis is a 80 y.o. female.  HPI   80 year old female with history of fibromyalgia, anxiety, thyroid disease, and history of prior pneumonia presenting for evaluation of a cough. Patient reports for the past 2 days she has had nasal congestion, hoarseness, and a cough. She felt that the cough is getting progressively worse. Cough is productive with white sputum. The symptom does not keep her up at night. She denies having fever, chills, headache, neck pain, sneezing, sore throat, ear pain, abdominal pain, nausea vomiting diarrhea, or rash. Denies any shortness of breath. No specific treatment tried. She did not have a flu shot or pneumonia shot this year. She is a nonsmoker. No recent hospitalization.   Past Medical History:  Diagnosis Date  . Anxiety   . Arthritis    "loaded" (10/22/2013)  . Back pain    "back is collapsing from the neck down" (10/22/2013)  . Depression   . Fibromyalgia   . GERD (gastroesophageal reflux disease)   . H/O hiatal hernia   . High cholesterol   . Hypothyroidism   . Osteoarthritis of left shoulder 10/22/2013  . Pneumonia 1957  . Scoliosis   . Sleep deprivation    "chronic" (10/22/2013)    Patient Active Problem List   Diagnosis Date Noted  . Osteoarthritis of left shoulder 10/22/2013  . Primary localized osteoarthrosis, shoulder region 10/22/2013    Past Surgical History:  Procedure Laterality Date  . ABDOMINAL HYSTERECTOMY  ~ 1970  . APPENDECTOMY  ~ 1970  . CATARACT EXTRACTION W/ INTRAOCULAR LENS  IMPLANT, BILATERAL Bilateral 4-12/2012  . LIPOSUCTION  1980's  . TONSILLECTOMY    . TOTAL SHOULDER ARTHROPLASTY Left 10/22/2013  . TOTAL SHOULDER ARTHROPLASTY Left 10/22/2013   Procedure: LEFT TOTAL SHOULDER ARTHROPLASTY;  Surgeon: Eulas PostJoshua P Landau, MD;   Location: [facility] OR;  Service: Orthopedics;  Laterality: Left;    OB History    No data available       Home Medications    Prior to Admission medications   Medication Sig Start Date End Date Taking? Authorizing Provider  Cholecalciferol (VITAMIN D-3) 5000 UNITS TABS Take 5,000 Units by mouth daily.     Historical Provider, MD  clonazePAM (KLONOPIN) 1 MG tablet Take 1.5 mg by mouth at bedtime.    Historical Provider, MD  co-enzyme Q-10 30 MG capsule Take 30 mg by mouth 2 (two) times daily.    Historical Provider, MD  Digestive Enzymes (DIGESTIVE ENZYME PO) Take 1-2 capsules by mouth 3 (three) times daily with meals. Dose depends on components of meal.    Historical Provider, MD  Escitalopram Oxalate (LEXAPRO PO) Take by mouth.    Historical Provider, MD  LORazepam (ATIVAN PO) Take by mouth.    Historical Provider, MD  Lysine 500 MG CAPS Take 500 mg by mouth.    Historical Provider, MD  Melatonin 5 MG TABS Take 5 mg by mouth at bedtime.    Historical Provider, MD  methocarbamol (ROBAXIN) 750 MG tablet Take 1,500 mg by mouth 3 (three) times daily.    Historical Provider, MD  niacin 50 MG tablet Take 50 mg by mouth 2 (two) times daily.    Historical Provider, MD  ondansetron (ZOFRAN) 4 MG tablet Take 1 tablet (4 mg total) by mouth every  8 (eight) hours as needed for nausea or vomiting. 10/22/13   Teryl LucyJoshua Landau, MD  OVER THE COUNTER MEDICATION Take 25 mg by mouth daily. MED NAME: DHEA-F all natural supplement.    Historical Provider, MD  OVER THE COUNTER MEDICATION Take 1 tablet by mouth 2 (two) times daily. MED NAME: Ultra Hair Plus.    Historical Provider, MD  oxyCODONE-acetaminophen (ROXICET) 5-325 MG per tablet Take 1-2 tablets by mouth every 6 (six) hours as needed for severe pain. 10/23/13   Teryl LucyJoshua Landau, MD  pantoprazole (PROTONIX) 40 MG tablet Take 40 mg by mouth every morning.    Historical Provider, MD  predniSONE (DELTASONE) 10 MG tablet 6 day stepdown dose 12/18/14   Teressa LowerVrinda Pickering,  NP  PRESCRIPTION MEDICATION Apply 1 application topically once a week. Compounded testosterone 2% cream. Applies small amount to inner thigh once weekly, on Saturdays or Sundays.    Historical Provider, MD  PRESCRIPTION MEDICATION Apply 1 application topically daily. Compounded estrogen cream 0.1%    Historical Provider, MD  progesterone (PROMETRIUM) 200 MG capsule Take 200 mg by mouth daily.    Historical Provider, MD  Red Yeast Rice Extract (RED YEAST RICE PO) Take 1 tablet by mouth 2 (two) times daily.    Historical Provider, MD  sennosides-docusate sodium (SENOKOT-S) 8.6-50 MG tablet Take 2 tablets by mouth daily. 10/22/13   Teryl LucyJoshua Landau, MD  Specialty Vitamins Products (ONE-A-DAY BONE STRENGTH PO) Take 1 tablet by mouth daily.    Historical Provider, MD  thyroid (ARMOUR) 30 MG tablet Take 30 mg by mouth daily before breakfast.    Historical Provider, MD    Family History No family history on file.  Social History Social History  Substance Use Topics  . Smoking status: Former Smoker    Packs/day: 1.50    Years: 11.00    Types: Cigarettes  . Smokeless tobacco: Never Used     Comment: 10/22/2013 "smoked cigarettes til age 80 or [age]"  . Alcohol use 8.4 oz/week    14 Glasses of wine per week     Allergies   Celebrex [celecoxib]; Chlorine; Codeine; Gabapentin; and Latex   Review of Systems Review of Systems  All other systems reviewed and are negative.    Physical Exam Updated Vital Signs BP 156/87 (BP Location: Right Arm)   Pulse 82   Temp 98.8 F (37.1 C) (Oral)   Resp 18   Ht 5\' 3"  (1.6 m)   Wt 60.7 kg   SpO2 100%   BMI 23.72 kg/m   Physical Exam  Constitutional: She appears well-developed and well-nourished. No distress.  HENT:  Head: Atraumatic.  Right Ear: External ear normal.  Left Ear: External ear normal.  Nose: Nose normal.  Mouth/Throat: Oropharynx is clear and moist.  Eyes: Conjunctivae are normal.  Neck: Neck supple.  Cardiovascular: Normal rate,  regular rhythm and intact distal pulses.   Pulmonary/Chest: Effort normal and breath sounds normal.  Occasional nonproductive cough in the room, lungs are clear to auscultation without wheezes, rales, rhonchi  Abdominal: Soft. There is no tenderness.  Musculoskeletal: She exhibits no edema.  Neurological: She is alert.  Skin: No rash noted.  Psychiatric: She has a normal mood and affect.  Nursing note and vitals reviewed.    ED Treatments / Results  Labs (all labs ordered are listed, but only abnormal results are displayed) Labs Reviewed - No data to display  EKG  EKG Interpretation None       Radiology Dg Chest 2 View  Result Date: 07/01/2016 CLINICAL DATA:  Coughing congestion over the last 3 days. EXAM: CHEST  2 VIEW COMPARISON:  10/11/2014. FINDINGS: Heart size is normal. Mediastinal shadows are normal. There is infiltrate in the right lower lobe consistent with pneumonia. Increased density projected over the medial right upper chest probably relates to first rib end density. No evidence of infiltrate, collapse or effusion. Old vertebral augmentation of T5. Old fracture of T9. IMPRESSION: Infiltrate right lower lobe consistent with pneumonia. Electronically Signed   By: Paulina Fusi M.D.   On: 07/01/2016 10:22    Procedures Procedures (including critical care time)  Medications Ordered in ED Medications - No data to display   Initial Impression / Assessment and Plan / ED Course  I have reviewed the triage vital signs and the nursing notes.  Pertinent labs & imaging results that were available during my care of the patient were reviewed by me and considered in my medical decision making (see chart for details).  Clinical Course    BP 156/87 (BP Location: Right Arm)   Pulse 82   Temp 98.8 F (37.1 C) (Oral)   Resp 18   Ht 5\' 3"  (1.6 m)   Wt 60.7 kg   SpO2 100%   BMI 23.72 kg/m    Final Clinical Impressions(s) / ED Diagnoses   Final diagnoses:  Community  acquired pneumonia of right lower lobe of lung (HCC)    New Prescriptions New Prescriptions   AZITHROMYCIN (ZITHROMAX) 250 MG TABLET    Take 1 tablet (250 mg total) by mouth daily.   BENZONATATE (TESSALON) 100 MG CAPSULE    Take 1 capsule (100 mg total) by mouth 3 (three) times daily as needed for cough.    10:24 AM Patient presents with symptoms suggestive of viral URI. Chest x-ray ordered to rule out pneumonia. She is afebrile and no hypoxia. Care discussed with DR. Tegeler.   10:36 AM X-ray demonstrated a right lobe pneumonia. No recent hospitalization the patient was treated for community acquired pneumonia with Z-Pak. Tessalon Perles  prescribed for cough. Encouraged patient to follow-up with PCP in the next 5 days for a recheck. return precaution discussed.    Fayrene Helper, PA-C 07/01/16 1037    Heide Scales, MD 07/01/16 [phone]

## 2016-07-01 NOTE — ED Triage Notes (Signed)
Pt reports dry cough x yesterday, with "itchy throat", and some white nasal congestion.

## 2016-07-01 NOTE — ED Notes (Signed)
Pt ambulatory with steady gait at discharge. Directed to pharmacy to pick up prescriptions

## 2017-04-15 ENCOUNTER — Encounter (HOSPITAL_BASED_OUTPATIENT_CLINIC_OR_DEPARTMENT_OTHER): Payer: Self-pay | Admitting: Emergency Medicine

## 2017-04-15 ENCOUNTER — Emergency Department (HOSPITAL_BASED_OUTPATIENT_CLINIC_OR_DEPARTMENT_OTHER): Payer: Medicare Other

## 2017-04-15 ENCOUNTER — Emergency Department (HOSPITAL_BASED_OUTPATIENT_CLINIC_OR_DEPARTMENT_OTHER)
Admission: EM | Admit: 2017-04-15 | Discharge: 2017-04-15 | Disposition: A | Discharge status: Home / Self Care | Payer: Medicare Other | Attending: Emergency Medicine | Admitting: Emergency Medicine

## 2017-04-15 DIAGNOSIS — R0789 Other chest pain: Secondary | ICD-10-CM | POA: Insufficient documentation

## 2017-04-15 DIAGNOSIS — E039 Hypothyroidism, unspecified: Secondary | ICD-10-CM | POA: Diagnosis not present

## 2017-04-15 DIAGNOSIS — R05 Cough: Secondary | ICD-10-CM | POA: Insufficient documentation

## 2017-04-15 DIAGNOSIS — R059 Cough, unspecified: Secondary | ICD-10-CM

## 2017-04-15 DIAGNOSIS — Z91040 Latex allergy status: Secondary | ICD-10-CM | POA: Insufficient documentation

## 2017-04-15 LAB — BASIC METABOLIC PANEL
Anion gap: 9 (ref 5–15)
BUN: 15 mg/dL (ref 6–20)
CHLORIDE: 99 mmol/L — AB (ref 101–111)
CO2: 24 mmol/L (ref 22–32)
CREATININE: 0.71 mg/dL (ref 0.44–1.00)
Calcium: 8.6 mg/dL — ABNORMAL LOW (ref 8.9–10.3)
GFR calc Af Amer: >60 mL/min (ref >60)
GFR calc non Af Amer: >60 mL/min (ref >60)
Glucose, Bld: 106 mg/dL — ABNORMAL HIGH (ref 65–99)
POTASSIUM: 4 mmol/L (ref 3.5–5.1)
SODIUM: 132 mmol/L — AB (ref 135–145)

## 2017-04-15 LAB — TROPONIN I: Troponin I: <0.03 ng/mL (ref <0.03)

## 2017-04-15 LAB — CBC WITH DIFFERENTIAL/PLATELET
Basophils Absolute: 0 10*3/uL (ref 0.0–0.1)
Basophils Relative: 1 %
EOS PCT: 3 %
Eosinophils Absolute: 0.2 10*3/uL (ref 0.0–0.7)
HEMATOCRIT: 38.3 % (ref 36.0–46.0)
Hemoglobin: 13.4 g/dL (ref 12.0–15.0)
LYMPHS ABS: 2.2 10*3/uL (ref 0.7–4.0)
LYMPHS PCT: 27 %
MCH: 33.6 pg (ref 26.0–34.0)
MCHC: 35 g/dL (ref 30.0–36.0)
MCV: 96 fL (ref 78.0–100.0)
MONO ABS: 1 10*3/uL (ref 0.1–1.0)
Monocytes Relative: 12 %
NEUTROS ABS: 4.6 10*3/uL (ref 1.7–7.7)
Neutrophils Relative %: 57 %
Platelets: 257 10*3/uL (ref 150–400)
RBC: 3.99 MIL/uL (ref 3.87–5.11)
RDW: 11.2 % — AB (ref 11.5–15.5)
WBC: 8 10*3/uL (ref 4.0–10.5)

## 2017-04-15 MED ORDER — OXYCODONE-ACETAMINOPHEN 5-325 MG PO TABS: 1.0000 | ORAL_TABLET | Freq: Four times a day (QID) | ORAL | 0 refills | Status: DC | PRN

## 2017-04-15 MED ORDER — OXYCODONE-ACETAMINOPHEN 5-325 MG PO TABS
1.0000 | ORAL_TABLET | Freq: Once | ORAL | Status: AC
  Administered 2017-04-15: 1 via ORAL
  Filled 2017-04-15: qty 1

## 2017-04-15 MED ORDER — DOCUSATE SODIUM 100 MG PO CAPS: 100.0000 mg | ORAL_CAPSULE | Freq: Two times a day (BID) | ORAL | 0 refills | Status: AC

## 2017-04-15 NOTE — Discharge Instructions (Signed)

## 2017-04-15 NOTE — ED Triage Notes (Signed)
Patient states that she was recently exposed to someone with an URI - patient reports that she has cough and pain when she takes a deep breath

## 2017-04-15 NOTE — ED Provider Notes (Signed)
Emergency Department Provider Note   I have reviewed the triage vital signs and the nursing notes.   HISTORY  Chief Complaint Cough   HPI Meagan Mathis is a 81 y.o. female with PMH of HLD, arthritis, and back pain presents to the emergency room in for evaluation of resolving URI symptoms including cough. Patient developed severe right sided back/chest wall pain during severe coughing. She now has pain with coughing or movement. No fevers or chills. She's having productive cough. Denies nausea, vomiting, diarrhea. Her husband had URI symptoms and she believes she got something from him. No blood in the sputum. She has taken muscle relaxer and Tramadol without significant relief. No radiation of symptoms.    Past Medical History:  Diagnosis Date  . Anxiety   . Arthritis    "loaded" (10/22/2013)  . Back pain    "back is collapsing from the neck down" (10/22/2013)  . Depression   . Fibromyalgia   . GERD (gastroesophageal reflux disease)   . H/O hiatal hernia   . High cholesterol   . Hypothyroidism   . Osteoarthritis of left shoulder 10/22/2013  . Pneumonia 1957  . Scoliosis   . Sleep deprivation    "chronic" (10/22/2013)    Patient Active Problem List   Diagnosis Date Noted  . Osteoarthritis of left shoulder 10/22/2013  . Primary localized osteoarthrosis, shoulder region 10/22/2013    Past Surgical History:  Procedure Laterality Date  . ABDOMINAL HYSTERECTOMY  ~ 1970  . APPENDECTOMY  ~ 1970  . CATARACT EXTRACTION W/ INTRAOCULAR LENS  IMPLANT, BILATERAL Bilateral 4-12/2012  . LIPOSUCTION  1980's  . TONSILLECTOMY    . TOTAL SHOULDER ARTHROPLASTY Left 10/22/2013  . TOTAL SHOULDER ARTHROPLASTY Left 10/22/2013   Procedure: LEFT TOTAL SHOULDER ARTHROPLASTY;  Surgeon: Eulas Post, MD;  Location: [facility] OR;  Service: Orthopedics;  Laterality: Left;    Current Outpatient Rx  . Order #: [id number] Class: Print  . Order #: [id number] Class: Print  . Order #: [id number] Class:  Historical Med  . Order #: [id number] Class: Historical Med  . Order #: [id number] Class: Historical Med  . Order #: [id number] Class: Print  . Order #: [id number] Class: Historical Med  . Order #: [id number] Class: Historical Med  . Order #: [id number] Class: Historical Med  . Order #: [id number] Class: Historical Med  . Order #: [id number] Class: Historical Med  . Order #: [id number] Class: Historical Med  . Order #: [id number] Class: Print  . Order #: [id number] Class: Historical Med  . Order #: [id number] Class: Historical Med  . Order #: [id number] Class: Historical Med    Allergies Celebrex [celecoxib]; Chlorine; Codeine; Gabapentin; and Latex  History reviewed. No pertinent family history.  Social History Social History  Substance Use Topics  . Smoking status: Former Smoker    Packs/day: 1.50    Years: 11.00    Types: Cigarettes  . Smokeless tobacco: Never Used     Comment: 10/22/2013 "smoked cigarettes til age 82 or 39"  . Alcohol use 8.4 oz/week    14 Glasses of wine per week    Review of Systems  Constitutional: No fever/chills Eyes: No visual changes. ENT: No sore throat. Cardiovascular: Posterior chest pain. Respiratory: Denies shortness of breath. Positive cough.  Gastrointestinal: No abdominal pain.  No nausea, no vomiting.  No diarrhea.  No constipation. Genitourinary: Negative for dysuria. Musculoskeletal: Negative for back pain. Skin: Negative for rash. Neurological: Negative for headaches, focal weakness or numbness.  10-point ROS otherwise negative.  ____________________________________________   PHYSICAL EXAM:  VITAL SIGNS: ED Triage Vitals  Enc Vitals Group     BP 04/15/17 1719 (!) 152/83     Pulse Rate 04/15/17 1719 80     Resp 04/15/17 1719 18     Temp 04/15/17 1719 98.7 F (37.1 C)     Temp Source 04/15/17 1719 Oral     SpO2 04/15/17 1719 100 %     Weight 04/15/17 1719 126 lb (57.2 kg)     Height 04/15/17 1719 5\' 2"  (1.575 m)     Pain Score 04/15/17 1718 8     Constitutional: Alert and oriented. Well appearing and in no acute distress. Eyes: Conjunctivae are normal. Head: Atraumatic. Nose: No congestion/rhinnorhea. Mouth/Throat: Mucous membranes are moist.  Neck: No stridor.   Cardiovascular: Normal rate, regular rhythm. Good peripheral circulation. Grossly normal heart sounds.   Respiratory: Normal respiratory effort.  No retractions. Lungs CTAB. Gastrointestinal: Soft and nontender. No distention.  Musculoskeletal: No lower extremity tenderness nor edema. No gross deformities of extremities. Tenderness to palpation of the right posterior and right lateral chest wall.  Neurologic:  Normal speech and language. No gross focal neurologic deficits are appreciated.  Skin:  Skin is warm, dry and intact. No rash noted.  ____________________________________________   LABS (all labs ordered are listed, but only abnormal results are displayed)  Labs Reviewed  BASIC METABOLIC PANEL - Abnormal; Notable for the following:       Result Value   Sodium 132 (*)    Chloride 99 (*)    Glucose, Bld 106 (*)    Calcium 8.6 (*)    All other components within normal limits  CBC WITH DIFFERENTIAL/PLATELET - Abnormal; Notable for the following:    RDW 11.2 (*)    All other components within normal limits  TROPONIN I   ____________________________________________  EKG   EKG Interpretation  Date/Time:  Saturday April 15 2017 17:52:46 EDT Ventricular Rate:  72 PR Interval:    QRS Duration: 84 QT Interval:  381 QTC Calculation: 417 R Axis:   27 Text Interpretation:  Sinus rhythm No STEMI.  Confirmed by Alona Bene [phone]) on 04/15/2017 7:00:16 PM Also confirmed by Alona Bene [phone]), editor Misty Stanley [phone])  on 04/16/2017 7:44:21 AM       ____________________________________________  RADIOLOGY  Dg Chest 2 View  Result Date: 04/15/2017 CLINICAL DATA:  Cough and back pain EXAM: CHEST  2 VIEW COMPARISON:  03/13/2017 chest  radiograph. FINDINGS: Partially visualized left total shoulder arthroplasty. Partially visualized surgical hardware overlying the lower lumbar spine. Stable cardiomediastinal silhouette with normal heart size. No pneumothorax. No pleural effusion. Lungs appear clear, with no acute consolidative airspace disease and no pulmonary edema. Re- demonstration of vertebroplasty material within of the upper thoracic vertebral compression fracture. Additional midthoracic vertebral compression fractures chronic and stable since 10/11/2014 CT. IMPRESSION: No active cardiopulmonary disease. Electronically Signed   By: Delbert Phenix M.D.   On: 04/15/2017 17:52    ____________________________________________   PROCEDURES  Procedure(s) performed:   Procedures  None ____________________________________________   INITIAL IMPRESSION / ASSESSMENT AND PLAN / ED COURSE  Pertinent labs & imaging results that were available during my care of the patient were reviewed by me and considered in my medical decision making (see chart for details).  Patient presents to the emergency room in for evaluation of chest pain. Worse with coughing and movement. Seems very musculoskeletal. Given the patient's age plan for labs including troponin along with EKG. She improved with Percocet. Suspect muscle strain or possible rib fracture with severe coughing.  No hypoxemia here.   At this time, I do not feel there is any life-threatening condition present. I have reviewed and discussed all results (EKG, imaging, lab, urine as appropriate), exam findings with patient. I have reviewed nursing notes and appropriate previous records.  I feel the patient is safe to be discharged home without further emergent workup. Discussed usual and customary return precautions. Patient and family (if present) verbalize understanding and are comfortable with this plan.  Patient will follow-up with their primary care provider. If they do not have a primary  care provider, information for follow-up has been provided to them. All questions have been answered.  ____________________________________________  FINAL CLINICAL IMPRESSION(S) / ED DIAGNOSES  Final diagnoses:  Cough  Chest wall pain     MEDICATIONS GIVEN DURING THIS VISIT:  Medications  oxyCODONE-acetaminophen (PERCOCET/ROXICET) 5-325 MG per tablet 1 tablet (1 tablet Oral Given 04/15/17 1753)     NEW OUTPATIENT MEDICATIONS STARTED DURING THIS VISIT:  Discharge Medication List as of 04/15/2017  7:17 PM    START taking these medications   Details  docusate sodium (COLACE) 100 MG capsule Take 1 capsule (100 mg total) by mouth every 12 (twelve) hours., Starting Sat 04/15/2017, Print    oxyCODONE-acetaminophen (PERCOCET/ROXICET) 5-325 MG tablet Take 1 tablet by mouth every 6 (six) hours as needed for severe pain., Starting Sat 04/15/2017, Print          Note:  This document was prepared using Dragon voice recognition software and may include unintentional dictation errors.  Alona Bene, MD Emergency Medicine    Amun Stemm, Arlyss Repress, MD 04/16/17 1130

## 2017-10-08 ENCOUNTER — Encounter (HOSPITAL_BASED_OUTPATIENT_CLINIC_OR_DEPARTMENT_OTHER): Payer: Self-pay | Admitting: *Deleted

## 2017-10-08 ENCOUNTER — Other Ambulatory Visit: Payer: Self-pay

## 2017-10-08 ENCOUNTER — Emergency Department (HOSPITAL_BASED_OUTPATIENT_CLINIC_OR_DEPARTMENT_OTHER)
Admission: EM | Admit: 2017-10-08 | Discharge: 2017-10-08 | Disposition: A | Discharge status: Home / Self Care | Payer: Medicare Other | Attending: Emergency Medicine | Admitting: Emergency Medicine

## 2017-10-08 DIAGNOSIS — Z87891 Personal history of nicotine dependence: Secondary | ICD-10-CM | POA: Diagnosis not present

## 2017-10-08 DIAGNOSIS — F1010 Alcohol abuse, uncomplicated: Secondary | ICD-10-CM | POA: Diagnosis not present

## 2017-10-08 DIAGNOSIS — R531 Weakness: Secondary | ICD-10-CM | POA: Diagnosis present

## 2017-10-08 DIAGNOSIS — Z79899 Other long term (current) drug therapy: Secondary | ICD-10-CM | POA: Insufficient documentation

## 2017-10-08 DIAGNOSIS — M6281 Muscle weakness (generalized): Secondary | ICD-10-CM | POA: Insufficient documentation

## 2017-10-08 DIAGNOSIS — E039 Hypothyroidism, unspecified: Secondary | ICD-10-CM | POA: Insufficient documentation

## 2017-10-08 DIAGNOSIS — R030 Elevated blood-pressure reading, without diagnosis of hypertension: Secondary | ICD-10-CM | POA: Diagnosis not present

## 2017-10-08 LAB — CBC WITH DIFFERENTIAL/PLATELET
BASOS ABS: 0.1 10*3/uL (ref 0.0–0.1)
Basophils Relative: 1 %
Eosinophils Absolute: 0.2 10*3/uL (ref 0.0–0.7)
Eosinophils Relative: 2 %
HEMATOCRIT: 40 % (ref 36.0–46.0)
HEMOGLOBIN: 14.3 g/dL (ref 12.0–15.0)
LYMPHS PCT: 18 %
Lymphs Abs: 1.4 10*3/uL (ref 0.7–4.0)
MCH: 34.5 pg — ABNORMAL HIGH (ref 26.0–34.0)
MCHC: 35.8 g/dL (ref 30.0–36.0)
MCV: 96.4 fL (ref 78.0–100.0)
MONO ABS: 0.6 10*3/uL (ref 0.1–1.0)
Monocytes Relative: 8 %
NEUTROS ABS: 5.4 10*3/uL (ref 1.7–7.7)
Neutrophils Relative %: 71 %
Platelets: 252 10*3/uL (ref 150–400)
RBC: 4.15 MIL/uL (ref 3.87–5.11)
RDW: 13.8 % (ref 11.5–15.5)
WBC: 7.6 10*3/uL (ref 4.0–10.5)

## 2017-10-08 LAB — COMPREHENSIVE METABOLIC PANEL
ALK PHOS: 70 U/L (ref 38–126)
ALT: 17 U/L (ref 14–54)
AST: 29 U/L (ref 15–41)
Albumin: 4 g/dL (ref 3.5–5.0)
Anion gap: 8 (ref 5–15)
BILIRUBIN TOTAL: 0.6 mg/dL (ref 0.3–1.2)
BUN: 14 mg/dL (ref 6–20)
CALCIUM: 8.6 mg/dL — AB (ref 8.9–10.3)
CO2: 24 mmol/L (ref 22–32)
CREATININE: 0.95 mg/dL (ref 0.44–1.00)
Chloride: 102 mmol/L (ref 101–111)
GFR calc Af Amer: >60 mL/min (ref >60)
GFR, EST NON AFRICAN AMERICAN: 55 mL/min — AB (ref >60)
Glucose, Bld: 112 mg/dL — ABNORMAL HIGH (ref 65–99)
Potassium: 3.6 mmol/L (ref 3.5–5.1)
Sodium: 134 mmol/L — ABNORMAL LOW (ref 135–145)
TOTAL PROTEIN: 7.2 g/dL (ref 6.5–8.1)

## 2017-10-08 LAB — TROPONIN I

## 2017-10-08 NOTE — ED Notes (Signed)
Pt on monitor 

## 2017-10-08 NOTE — ED Provider Notes (Signed)
[facility] EMERGENCY DEPARTMENT Provider Note   CSN: [id number] Arrival date & time: 10/08/17  1224     History   Chief Complaint Chief Complaint  Patient presents with  . Weakness    HPI Meagan Mathis is a 82 y.o. female.  She is obtained from patient and from her husband and daughter who accompany her  HPI She complained of generalized weakness onset last night which continued into this morning.  She is presently asymptomatic.  She denies having had any chest pain no fever no abdominal pain no shortness of breath no headache no urinary symptoms.  Her husband and daughter report that she drank quite a lot of alcohol last night and feel that she has an alcohol problem.  Patient states she drank only 1 glass of wine last night however both family members accompany her saying she drinks quite a bit more.  They also report that she had diminished appetite over several months and weight loss.  Patient is presently asymptomatic without treatment. Past Medical History:  Diagnosis Date  . Anxiety   . Arthritis    "loaded" (10/22/2013)  . Back pain    "back is collapsing from the neck down" (10/22/2013)  . Depression   . Fibromyalgia   . GERD (gastroesophageal reflux disease)   . H/O hiatal hernia   . High cholesterol   . Hypothyroidism   . Osteoarthritis of left shoulder 10/22/2013  . Pneumonia 1957  . Scoliosis   . Sleep deprivation    "chronic" (10/22/2013)    Patient Active Problem List   Diagnosis Date Noted  . Osteoarthritis of left shoulder 10/22/2013  . Primary localized osteoarthrosis, shoulder region 10/22/2013    Past Surgical History:  Procedure Laterality Date  . ABDOMINAL HYSTERECTOMY  ~ 1970  . APPENDECTOMY  ~ 1970  . CATARACT EXTRACTION W/ INTRAOCULAR LENS  IMPLANT, BILATERAL Bilateral 4-12/2012  . LIPOSUCTION  1980's  . TONSILLECTOMY    . TOTAL SHOULDER ARTHROPLASTY Left 10/22/2013  . TOTAL SHOULDER ARTHROPLASTY Left 10/22/2013   Procedure: LEFT  TOTAL SHOULDER ARTHROPLASTY;  Surgeon: Eulas Post, MD;  Location: [facility] OR;  Service: Orthopedics;  Laterality: Left;    OB History    No data available       Home Medications    Prior to Admission medications   Medication Sig Start Date End Date Taking? Authorizing Provider  Cholecalciferol (VITAMIN D-3) 5000 UNITS TABS Take 5,000 Units by mouth daily.     [provider]  co-enzyme Q-10 30 MG capsule Take 30 mg by mouth 2 (two) times daily.    [provider]  Digestive Enzymes (DIGESTIVE ENZYME PO) Take 1-2 capsules by mouth 3 (three) times daily with meals. Dose depends on components of meal.    [provider]  docusate sodium (COLACE) 100 MG capsule Take 1 capsule (100 mg total) by mouth every 12 (twelve) hours. 04/15/17   Long, Arlyss Repress, MD  Melatonin 5 MG TABS Take 5 mg by mouth at bedtime.    [provider]  methocarbamol (ROBAXIN) 750 MG tablet Take 1,500 mg by mouth 3 (three) times daily.    [provider]  niacin 50 MG tablet Take 50 mg by mouth 2 (two) times daily.    [provider]  OVER THE COUNTER MEDICATION Take 1 tablet by mouth 2 (two) times daily. MED NAME: Ultra Hair Plus.    [provider]  pantoprazole (PROTONIX) 40 MG tablet Take 40 mg by  mouth every morning.    [provider]  Specialty Vitamins Products (ONE-A-DAY BONE STRENGTH PO) Take 1 tablet by mouth daily.    [provider]  thyroid (ARMOUR) 30 MG tablet Take 30 mg by mouth daily before breakfast.    [provider]    Family History History reviewed. No pertinent family history.  Social History Social History   Tobacco Use  . Smoking status: Former Smoker    Packs/day: 1.50    Years: 11.00    Pack years: 16.50    Types: Cigarettes  . Smokeless tobacco: Never Used  . Tobacco comment: 10/22/2013 "smoked cigarettes til age 82 or [age]"  Substance Use Topics  . Alcohol use: Yes    Alcohol/week: 8.4 oz     Types: 14 Glasses of wine per week  . Drug use: No     Allergies   Celebrex [celecoxib]; Chlorine; Codeine; Gabapentin; and Latex   Review of Systems Review of Systems  Constitutional: Positive for appetite change and unexpected weight change.  HENT: Negative.   Respiratory: Negative.   Cardiovascular: Negative.   Gastrointestinal: Negative.   Musculoskeletal: Negative.   Skin: Negative.   Neurological: Positive for weakness.       Generalized weakness  Psychiatric/Behavioral: Negative.   All other systems reviewed and are negative.    Physical Exam Updated Vital Signs BP 137/64   Pulse 71   Temp 98.2 F (36.8 C) (Oral)   Resp 14   Ht 5\' 3"  (1.6 m)   Wt 58.1 kg (128 lb)   SpO2 100%   BMI 22.67 kg/m   Physical Exam  Constitutional: She is oriented to person, place, and time. She appears well-developed and well-nourished.  HENT:  Head: Normocephalic and atraumatic.  Eyes: Conjunctivae are normal. Pupils are equal, round, and reactive to light.  Neck: Neck supple. No tracheal deviation present. No thyromegaly present.  Cardiovascular: Normal rate and regular rhythm.  No murmur heard. Pulmonary/Chest: Effort normal and breath sounds normal.  Abdominal: Soft. Bowel sounds are normal. She exhibits no distension. There is no tenderness.  Musculoskeletal: Normal range of motion. She exhibits no edema or tenderness.  Neurological: She is alert and oriented to person, place, and time. Coordination normal.  Gait normal not lightheaded on standing  Skin: Skin is warm and dry. Capillary refill takes less than 2 seconds. No rash noted.  Psychiatric: She has a normal mood and affect.  Nursing note and vitals reviewed.    ED Treatments / Results  Labs (all labs ordered are listed, but only abnormal results are displayed) Labs Reviewed  CBC WITH DIFFERENTIAL/PLATELET - Abnormal; Notable for the following components:      Result Value   MCH 34.5 (*)    All other  components within normal limits  COMPREHENSIVE METABOLIC PANEL - Abnormal; Notable for the following components:   Sodium 134 (*)    Glucose, Bld 112 (*)    Calcium 8.6 (*)    GFR calc non Af Amer 55 (*)    All other components within normal limits  TROPONIN I    EKG  EKG Interpretation  Date/Time:  Sunday October 08 2017 12:30:24 EST Ventricular Rate:  70 PR Interval:  134 QRS Duration: 80 QT Interval:  388 QTC Calculation: 419 R Axis:   78 Text Interpretation:  Sinus rhythm with Premature atrial complexes Nonspecific ST abnormality Abnormal ECG Poor data quality Confirmed by Doug SouJacubowitz, Pierina Schuknecht [phone]([id number]) on 10/08/2017 2:10:07 PM  Radiology No results found.  Procedures Procedures (including critical care time)  Medications Ordered in ED Medications - No data to display  Results for orders placed or performed during the hospital encounter of 10/08/17  CBC with Differential  Result Value Ref Range   WBC 7.6 4.0 - 10.5 K/uL   RBC 4.15 3.87 - 5.11 MIL/uL   Hemoglobin 14.3 12.0 - 15.0 g/dL   HCT 16.1 09.6 - 04.5 %   MCV 96.4 78.0 - 100.0 fL   MCH 34.5 (H) 26.0 - 34.0 pg   MCHC 35.8 30.0 - 36.0 g/dL   RDW 40.9 81.1 - 91.4 %   Platelets 252 150 - 400 K/uL   Neutrophils Relative % 71 %   Neutro Abs 5.4 1.7 - 7.7 K/uL   Lymphocytes Relative 18 %   Lymphs Abs 1.4 0.7 - 4.0 K/uL   Monocytes Relative 8 %   Monocytes Absolute 0.6 0.1 - 1.0 K/uL   Eosinophils Relative 2 %   Eosinophils Absolute 0.2 0.0 - 0.7 K/uL   Basophils Relative 1 %   Basophils Absolute 0.1 0.0 - 0.1 K/uL  Comprehensive metabolic panel  Result Value Ref Range   Sodium 134 (L) 135 - 145 mmol/L   Potassium 3.6 3.5 - 5.1 mmol/L   Chloride 102 101 - 111 mmol/L   CO2 24 22 - 32 mmol/L   Glucose, Bld 112 (H) 65 - 99 mg/dL   BUN 14 6 - 20 mg/dL   Creatinine, Ser 7.82 0.44 - 1.00 mg/dL   Calcium 8.6 (L) 8.9 - 10.3 mg/dL   Total Protein 7.2 6.5 - 8.1 g/dL   Albumin 4.0 3.5 - 5.0 g/dL   AST 29 15 -  41 U/L   ALT 17 14 - 54 U/L   Alkaline Phosphatase 70 38 - 126 U/L   Total Bilirubin 0.6 0.3 - 1.2 mg/dL   GFR calc non Af Amer 55 (L) >60 mL/min   GFR calc Af Amer >60 >60 mL/min   Anion gap 8 5 - 15  Troponin I  Result Value Ref Range   Troponin I <0.03 <0.03 ng/mL   No results found. Initial Impression / Assessment and Plan / ED Course  I have reviewed the triage vital signs and the nursing notes.  Pertinent labs & imaging results that were available during my care of the patient were reviewed by me and considered in my medical decision making (see chart for details).     3:35 PM patient remains asymptomatic. Plan encourage oral hydration.  I suspect the patient has alcohol problem given the history which her to family members give.  Blood pressure recheck.  Resource guide given for substance alcohol abuse patient instructed to follow-up with PMD.  Final Clinical Impressions(s) / ED Diagnoses  Diagnosis #1 generalized weakness #2 chronic alcohol abuse Final diagnoses:  None  #3 elevated blood pressure  ED Discharge Orders    None       Doug Sou, MD 10/08/17 1542

## 2017-10-08 NOTE — Discharge Instructions (Signed)
Call Dr. Lucianne MussKumar tomorrow to arrange to be seen in the office in a week.  Your blood pressure should be rechecked.  Today's was elevated at 162/56.  You need to stop drinking alcohol beer and wine.  If you have a problem get help.  Call any of the numbers on the resource guide to get help or ask Dr. Lucianne MussKumar for help

## 2017-10-08 NOTE — ED Triage Notes (Signed)
Pt c/o generalized weakness x 2 days, denies n/v/d

## 2017-12-12 IMAGING — CR DG CHEST 2V
2 series · 2 of 2 positions shown · non-contrast
Comparison: 10/11/2014.

CLINICAL DATA: Coughing congestion over the last 3 days.

EXAM:
CHEST  2 VIEW

[w chest pa]
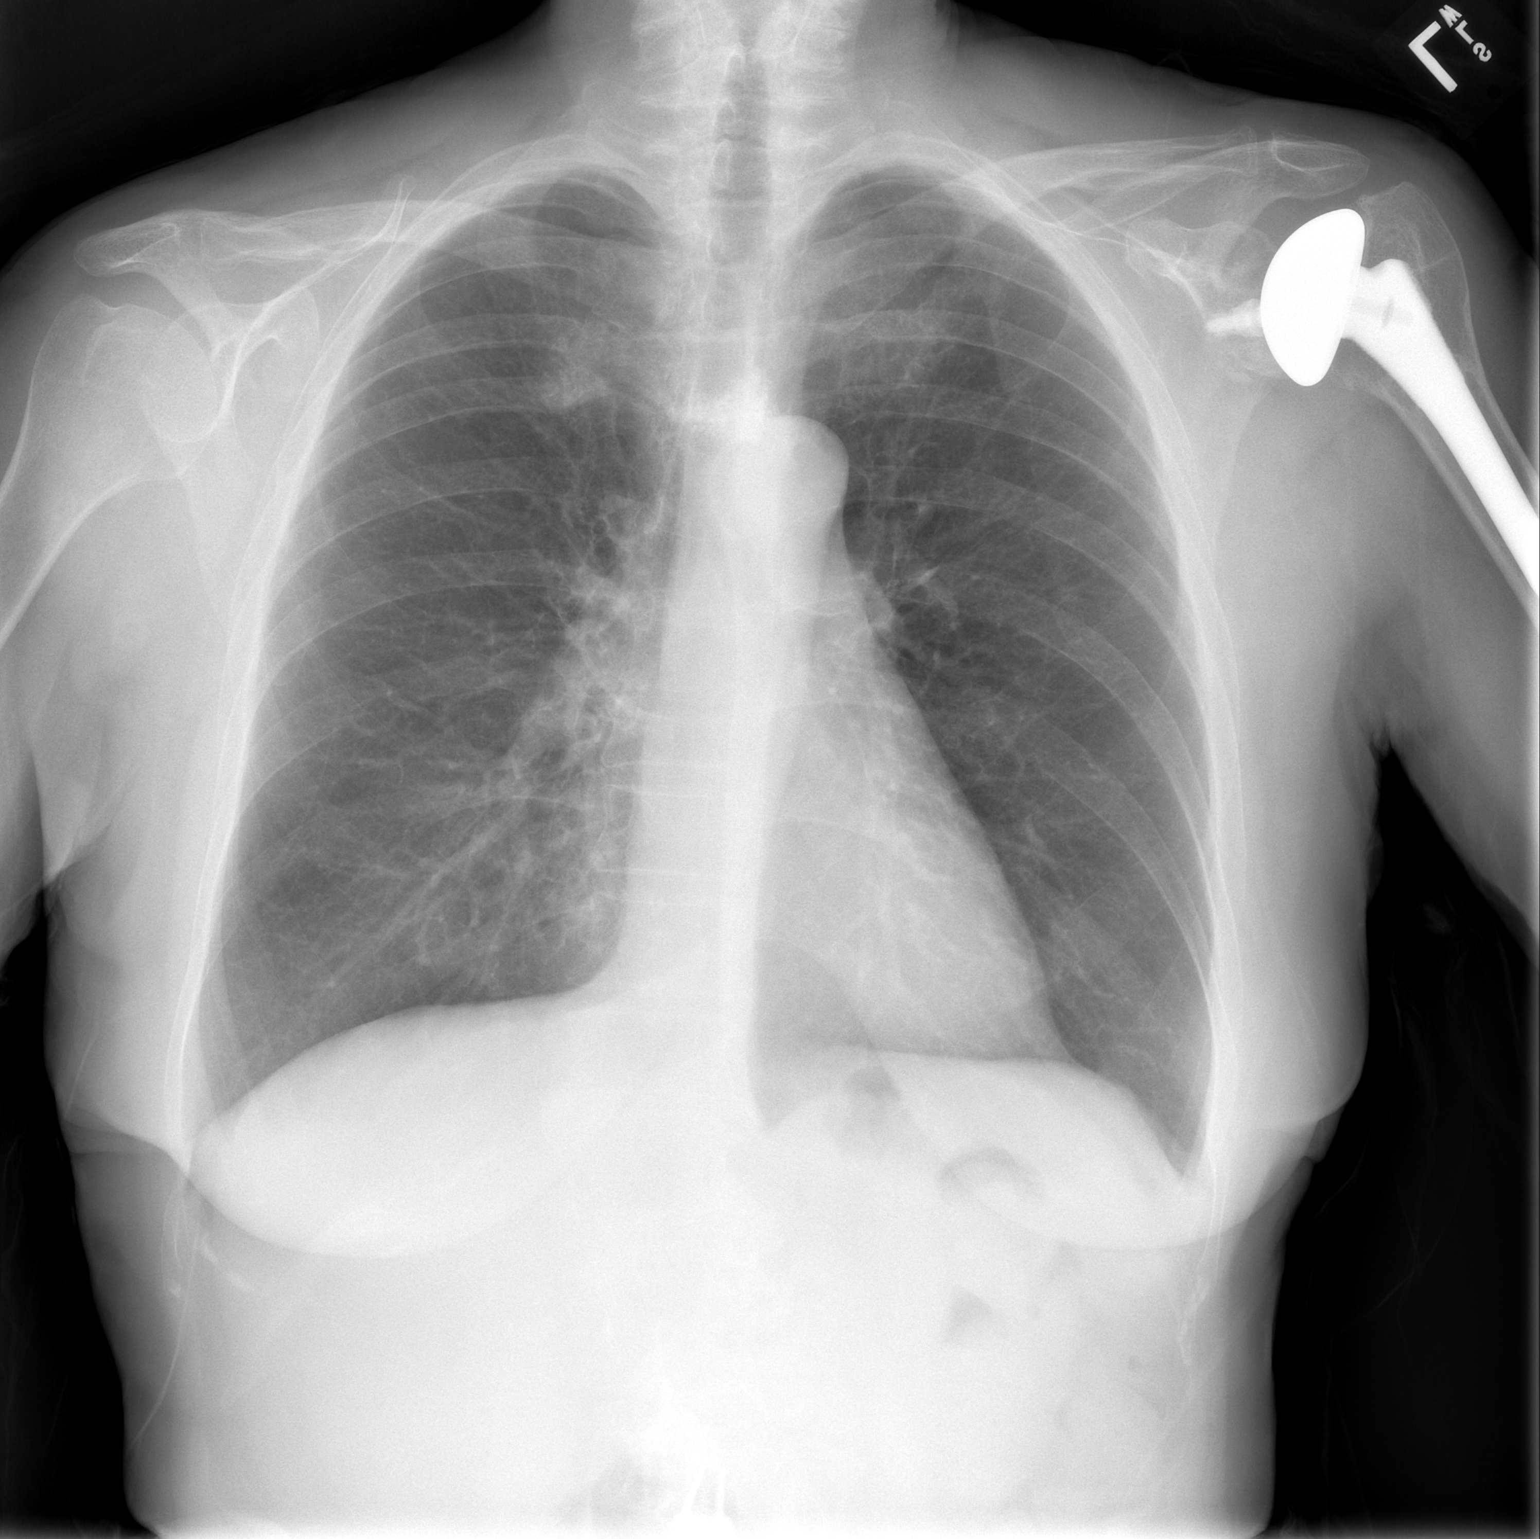

[w chest lat]
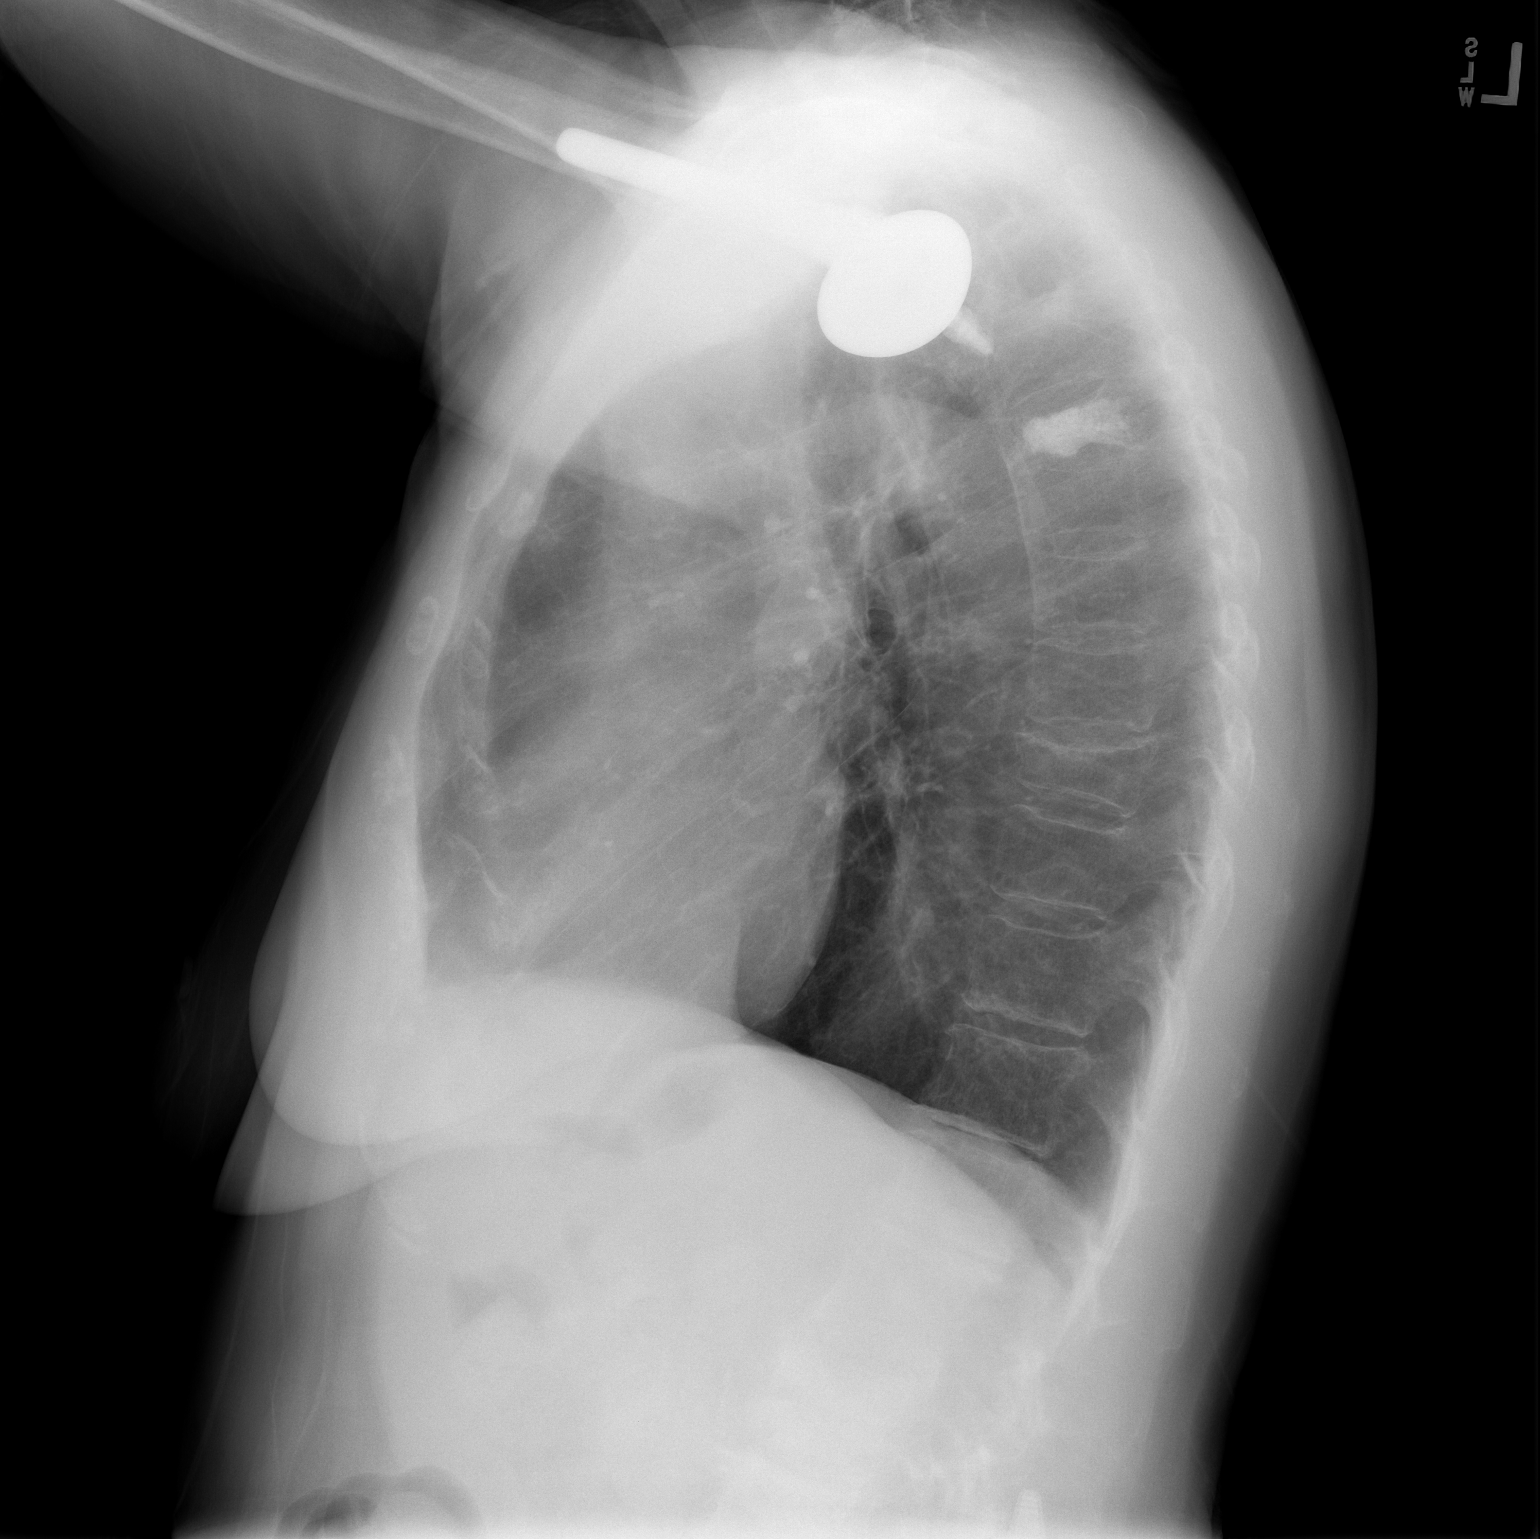

[2 of 2 positions shown; findings below may reference images not displayed]

FINDINGS: Heart size is normal. Mediastinal shadows are normal. There is
infiltrate in the right lower lobe consistent with pneumonia.
Increased density projected over the medial right upper chest
probably relates to first rib end density. No evidence of
infiltrate, collapse or effusion. Old vertebral augmentation of T5.
Old fracture of T9.
IMPRESSION: Infiltrate right lower lobe consistent with pneumonia.
